# Patient Record
Sex: Female | Born: 1950 | Race: Black or African American | Hispanic: No | Marital: Married | Smoking: Never smoker
Health system: Southern US, Community
[De-identification: ages and names within clinical notes are randomized; demographics above are authoritative.]

## PROBLEM LIST (undated history)

## (undated) DIAGNOSIS — E7800 Pure hypercholesterolemia, unspecified: Secondary | ICD-10-CM

## (undated) DIAGNOSIS — I10 Essential (primary) hypertension: Secondary | ICD-10-CM

## (undated) DIAGNOSIS — M797 Fibromyalgia: Secondary | ICD-10-CM

## (undated) DIAGNOSIS — D219 Benign neoplasm of connective and other soft tissue, unspecified: Secondary | ICD-10-CM

## (undated) DIAGNOSIS — R87619 Unspecified abnormal cytological findings in specimens from cervix uteri: Secondary | ICD-10-CM

## (undated) HISTORY — DX: Pure hypercholesterolemia, unspecified: E78.00

## (undated) HISTORY — PX: OTHER SURGICAL HISTORY: SHX169

## (undated) HISTORY — DX: Fibromyalgia: M79.7

## (undated) HISTORY — DX: Unspecified abnormal cytological findings in specimens from cervix uteri: R87.619

## (undated) HISTORY — PX: ABDOMINAL HYSTERECTOMY: SHX81

## (undated) HISTORY — DX: Essential (primary) hypertension: I10

## (undated) HISTORY — DX: Benign neoplasm of connective and other soft tissue, unspecified: D21.9

---

## 1998-05-15 HISTORY — PX: REDUCTION MAMMAPLASTY: SUR839

## 1998-05-31 ENCOUNTER — Ambulatory Visit (HOSPITAL_COMMUNITY): Admission: RE | Admit: 1998-05-31 | Discharge: 1998-05-31 | Payer: Self-pay | Admitting: Obstetrics and Gynecology

## 1998-06-24 ENCOUNTER — Other Ambulatory Visit: Admission: RE | Admit: 1998-06-24 | Discharge: 1998-06-24 | Payer: Self-pay | Admitting: Obstetrics and Gynecology

## 1998-07-13 ENCOUNTER — Inpatient Hospital Stay (HOSPITAL_COMMUNITY): Admission: RE | Admit: 1998-07-13 | Discharge: 1998-07-15 | Payer: Self-pay | Admitting: Obstetrics and Gynecology

## 1998-11-05 ENCOUNTER — Ambulatory Visit (HOSPITAL_COMMUNITY): Admission: RE | Admit: 1998-11-05 | Discharge: 1998-11-05 | Payer: Self-pay | Admitting: Gastroenterology

## 1999-07-20 ENCOUNTER — Encounter: Payer: Self-pay | Admitting: Surgery

## 1999-07-20 ENCOUNTER — Encounter: Admission: RE | Admit: 1999-07-20 | Discharge: 1999-07-20 | Payer: Self-pay | Admitting: Surgery

## 1999-07-21 ENCOUNTER — Ambulatory Visit (HOSPITAL_COMMUNITY): Admission: RE | Admit: 1999-07-21 | Discharge: 1999-07-21 | Payer: Self-pay | Admitting: Cardiology

## 1999-08-29 ENCOUNTER — Other Ambulatory Visit: Admission: RE | Admit: 1999-08-29 | Discharge: 1999-08-29 | Payer: Self-pay | Admitting: Obstetrics and Gynecology

## 2001-08-05 ENCOUNTER — Encounter: Admission: RE | Admit: 2001-08-05 | Discharge: 2001-08-05 | Payer: Self-pay | Admitting: Family Medicine

## 2001-08-05 ENCOUNTER — Encounter: Payer: Self-pay | Admitting: Family Medicine

## 2002-05-15 HISTORY — PX: BREAST BIOPSY: SHX20

## 2002-10-02 ENCOUNTER — Encounter: Admission: RE | Admit: 2002-10-02 | Discharge: 2002-10-02 | Payer: Self-pay | Admitting: Family Medicine

## 2002-10-02 ENCOUNTER — Encounter: Payer: Self-pay | Admitting: Family Medicine

## 2003-12-25 ENCOUNTER — Encounter: Admission: RE | Admit: 2003-12-25 | Discharge: 2003-12-25 | Payer: Self-pay | Admitting: Obstetrics and Gynecology

## 2005-07-06 ENCOUNTER — Encounter: Admission: RE | Admit: 2005-07-06 | Discharge: 2005-07-06 | Payer: Self-pay | Admitting: Obstetrics and Gynecology

## 2005-09-13 ENCOUNTER — Encounter: Payer: Self-pay | Admitting: Obstetrics and Gynecology

## 2006-01-12 ENCOUNTER — Encounter (INDEPENDENT_AMBULATORY_CARE_PROVIDER_SITE_OTHER): Payer: Self-pay | Admitting: *Deleted

## 2006-01-12 ENCOUNTER — Ambulatory Visit (HOSPITAL_BASED_OUTPATIENT_CLINIC_OR_DEPARTMENT_OTHER): Admission: RE | Admit: 2006-01-12 | Discharge: 2006-01-12 | Payer: Self-pay | Admitting: Surgery

## 2006-04-12 ENCOUNTER — Encounter: Admission: RE | Admit: 2006-04-12 | Discharge: 2006-04-12 | Payer: Self-pay | Admitting: Family Medicine

## 2007-03-28 ENCOUNTER — Encounter: Admission: RE | Admit: 2007-03-28 | Discharge: 2007-03-28 | Payer: Self-pay | Admitting: Obstetrics & Gynecology

## 2007-08-26 ENCOUNTER — Other Ambulatory Visit: Admission: RE | Admit: 2007-08-26 | Discharge: 2007-08-26 | Payer: Self-pay | Admitting: Obstetrics & Gynecology

## 2008-05-26 ENCOUNTER — Encounter: Admission: RE | Admit: 2008-05-26 | Discharge: 2008-05-26 | Payer: Self-pay | Admitting: Obstetrics & Gynecology

## 2008-08-27 ENCOUNTER — Other Ambulatory Visit: Admission: RE | Admit: 2008-08-27 | Discharge: 2008-08-27 | Payer: Self-pay | Admitting: Obstetrics and Gynecology

## 2009-02-03 ENCOUNTER — Ambulatory Visit (HOSPITAL_BASED_OUTPATIENT_CLINIC_OR_DEPARTMENT_OTHER): Admission: RE | Admit: 2009-02-03 | Discharge: 2009-02-03 | Payer: Self-pay | Admitting: Orthopedic Surgery

## 2009-07-26 ENCOUNTER — Encounter: Admission: RE | Admit: 2009-07-26 | Discharge: 2009-07-26 | Payer: Self-pay | Admitting: Obstetrics & Gynecology

## 2010-06-05 ENCOUNTER — Encounter: Payer: Self-pay | Admitting: Family Medicine

## 2010-06-05 ENCOUNTER — Encounter: Payer: Self-pay | Admitting: Obstetrics & Gynecology

## 2010-08-19 LAB — BASIC METABOLIC PANEL
BUN: 11 mg/dL (ref 6–23)
CO2: 28 mEq/L (ref 19–32)
Calcium: 9.4 mg/dL (ref 8.4–10.5)
Chloride: 101 mEq/L (ref 96–112)
Creatinine, Ser: 0.83 mg/dL (ref 0.4–1.2)
GFR calc Af Amer: >60 mL/min (ref >60)
GFR calc non Af Amer: >60 mL/min (ref >60)
Glucose, Bld: 107 mg/dL — ABNORMAL HIGH (ref 70–99)
Potassium: 3.4 mEq/L — ABNORMAL LOW (ref 3.5–5.1)
Sodium: 140 mEq/L (ref 135–145)

## 2010-08-19 LAB — POCT HEMOGLOBIN-HEMACUE: Hemoglobin: 12.5 g/dL (ref 12.0–15.0)

## 2010-08-23 ENCOUNTER — Other Ambulatory Visit: Payer: Self-pay | Admitting: Obstetrics & Gynecology

## 2010-08-23 DIAGNOSIS — Z1231 Encounter for screening mammogram for malignant neoplasm of breast: Secondary | ICD-10-CM

## 2010-08-29 ENCOUNTER — Ambulatory Visit
Admission: RE | Admit: 2010-08-29 | Discharge: 2010-08-29 | Disposition: A | Discharge status: Home / Self Care | Payer: Federal, State, Local not specified - PPO | Source: Ambulatory Visit | Attending: Obstetrics & Gynecology | Admitting: Obstetrics & Gynecology

## 2010-08-29 DIAGNOSIS — Z1231 Encounter for screening mammogram for malignant neoplasm of breast: Secondary | ICD-10-CM

## 2010-09-30 NOTE — Cardiovascular Report (Signed)
[place]  Patient:    Kayla Rush, BAUMGARDNER                  MRN: [id number] Proc. Date: 07/21/99 Adm. Date:  [id number] Disc. Date: [id number] Attending:  Corliss Marcus CC:         Joycelyn Rua, M.D.             Cardiac Catheterization Laboratory                        Cardiac Catheterization  CINE NUMBER:  [id number]  REFERRING PHYSICIAN:  Joycelyn Rua, M.D.  PROCEDURES PERFORMED: 1. Left heart catheterization. 2. Coronary angiography. 3. Left ventriculogram.  INDICATIONS:  Ms. Maliaka Brasington is a 60 year old woman who was admitted to a hospital in [place] overnight after a prolonged episode of anginal chest pain.  She ruled out for myocardial infarction.  She has not had a recurrence for this. Her ECG shows inferior T wave inversion suggestive of a thick inferior ischemia. She is brought now to the cardiac catheterization laboratory to identify a possible atherosclerotic cerebrovascular disease and provide for further therapeutic options.  DESCRIPTION OF PROCEDURE:  The patient was brought to the cardiac catheterization laboratory in the postabsorptive state.  The right groin was prepared and draped in the usual sterile fashion.  Local anesthesia was obtained with the infiltration of 1% lidocaine.  A 5 French catheter sheath was introduced percutaneously into the right femoral artery utilizing an anterior approach over a guiding J wire.  A 110 cm pigtail catheter was then advanced to the ascending aorta where the pressure was recorded.  The catheter was then prolapsed across the aortic valve and the pressure again recorded in the left ventricle both prior to and following the ventriculogram.  A 30 degree RAO cine left ventriculogram was performed utilizing an out by power injector.  Then 45 cc of nonionic contrast material were injected at 13 cc/sec.  Coronary angiography was then performed using a 5 [place] #4 right and left  Judkins catheters.  Cineangiography of each coronary artery was conducted in multiple LAO and RAO projections.  All catheter manipulations were performed  using fluoroscope observation and exchanges performed over a long guiding J wire. At the completion of the procedure, the catheter and catheter sheaths were removed. Hemostasis was achieved by direct pressure.  The patient was transported to the  recovery area in stable condition with intact distal pulses.  FLUOROSCOPY TIME:  3.7 minutes.  TOTAL CONTRAST UTILIZED:  Omnipaque 120 cc.  HEMODYNAMICS:  Systemic arterial pressure was 134/88 with a mean of 109 mmHg. There was no systolic gradient across the aortic valve.  The left ventricular end-diastolic pressure was 15 mmHg preventriculogram and 16 mmHg post ventriculogram.  ANGIOGRAPHY:  LEFT VENTRICULOGRAM:  The left ventriculogram demonstrated a normal chamber size and global systolic function.  There was no regional wall motion abnormalities noted.  There is no mitral regurgitation or coronary calcification seen.  The calculated ejection fraction utilizing a single plane cine method is 72%.  CORONARY ANGIOGRAPHY:  There was a right dominant coronary system present.  The  main left coronary artery was normal.  The main left coronary artery was short and normal.  The left anterior descending artery and its branches were normal.  The left circumflex artery and its branches were normal.  The right coronary artery and its branches were normal.  Collateral vessels were not seen.  FINAL IMPRESSION: 1. Normal  coronary arteries. 2. Intact left ventricular size and systolic function. 3. Noncardiac chest pain.  PLAN:  The patient is presented with this gratifying news.  She will be discharged on an outpatient basis after four hours of bedrest.  Followup with primary care  doctor on a p.r.n. basis if the chest discomfort recurs. DD:  07/21/99 TD:  07/23/99 Job:  [id number]

## 2011-09-20 ENCOUNTER — Other Ambulatory Visit: Payer: Self-pay | Admitting: Obstetrics & Gynecology

## 2011-09-20 DIAGNOSIS — Z1231 Encounter for screening mammogram for malignant neoplasm of breast: Secondary | ICD-10-CM

## 2011-09-28 ENCOUNTER — Ambulatory Visit
Admission: RE | Admit: 2011-09-28 | Discharge: 2011-09-28 | Disposition: A | Discharge status: Home / Self Care | Payer: Federal, State, Local not specified - PPO | Source: Ambulatory Visit | Attending: Obstetrics & Gynecology | Admitting: Obstetrics & Gynecology

## 2011-09-28 DIAGNOSIS — Z1231 Encounter for screening mammogram for malignant neoplasm of breast: Secondary | ICD-10-CM

## 2012-10-22 ENCOUNTER — Encounter: Payer: Self-pay | Admitting: Certified Nurse Midwife

## 2012-10-23 ENCOUNTER — Other Ambulatory Visit: Payer: Self-pay

## 2012-10-23 DIAGNOSIS — Z1231 Encounter for screening mammogram for malignant neoplasm of breast: Secondary | ICD-10-CM

## 2012-10-28 ENCOUNTER — Ambulatory Visit: Payer: Self-pay | Admitting: Certified Nurse Midwife

## 2012-11-20 ENCOUNTER — Ambulatory Visit
Admission: RE | Admit: 2012-11-20 | Discharge: 2012-11-20 | Disposition: A | Discharge status: Home / Self Care | Payer: Federal, State, Local not specified - PPO | Source: Ambulatory Visit

## 2012-11-20 DIAGNOSIS — Z1231 Encounter for screening mammogram for malignant neoplasm of breast: Secondary | ICD-10-CM

## 2012-11-27 ENCOUNTER — Encounter: Payer: Self-pay | Admitting: Certified Nurse Midwife

## 2012-11-27 ENCOUNTER — Ambulatory Visit (INDEPENDENT_AMBULATORY_CARE_PROVIDER_SITE_OTHER): Payer: Federal, State, Local not specified - PPO | Admitting: Certified Nurse Midwife

## 2012-11-27 VITALS — BP 124/74 | HR 64 | Resp 16 | Ht 68.25 in | Wt 268.0 lb

## 2012-11-27 DIAGNOSIS — Z01419 Encounter for gynecological examination (general) (routine) without abnormal findings: Secondary | ICD-10-CM

## 2012-11-27 NOTE — Progress Notes (Signed)
62 y.o. [id number] Married African American Fe here for annual exam. Menopausal no HRT. Denies vaginal bleeding or dryness. Hypertension and cholesterol stable on medication with PCP management. No health issues today.  Sees PCP aex, labs.  Son recently graduated from college!   Patient's last menstrual period was 06/15/1998.          Sexually active: yes  The current method of family planning is status post hysterectomy.    Exercising: no  exercise Smoker:  no  Health Maintenance: Pap:  08-27-08 neg MMG:  11-20-12 neg Colonoscopy:  2010 negative( 5 years due to family history) BMD:   2010 TDaP:  2005 Labs: none Self breast exam:monthly   reports that she has never smoked. She does not have any smokeless tobacco history on file. She reports that she does not drink alcohol or use illicit drugs.  Past Medical History  Diagnosis Date  . Hypertension   . Fibromyalgia   . Hypercholesteremia   . Fibroid     Past Surgical History  Procedure Laterality Date  . Augmentation mammaplasty      breast reduction  . Abdominal hysterectomy      2/00  . Bone spur Left   . Breast biopsy      times 3    Current Outpatient Prescriptions  Medication Sig Dispense Refill  . amLODipine (NORVASC) 5 MG tablet Take 5 mg by mouth daily.      Marland Kitchen aspirin 81 MG tablet Take 81 mg by mouth daily.      Marland Kitchen CALCIUM PO Take by mouth daily.      Marland Kitchen lisinopril-hydrochlorothiazide (PRINZIDE,ZESTORETIC) 20-25 MG per tablet Take 1 tablet by mouth daily.      . Multiple Vitamins-Minerals (MULTIVITAMIN PO) Take by mouth daily.      . pravastatin (PRAVACHOL) 80 MG tablet Take 80 mg by mouth daily.      . Vitamin D, Ergocalciferol, (DRISDOL) 50000 UNITS CAPS Take 50,000 Units by mouth every 14 (fourteen) days.       No current facility-administered medications for this visit.    Family History  Problem Relation Age of Onset  . Cancer Mother     colon  . Hypertension Mother   . Heart disease Father   . Heart  disease Brother   . Hypertension Maternal Grandmother   . Hypertension Maternal Grandfather     ROS:  Pertinent items are noted in HPI.  Otherwise, a comprehensive ROS was negative.  Exam:   BP 124/74  Pulse 64  Resp 16  Ht 5' 8.25" (1.734 m)  Wt 268 lb (121.564 kg)  BMI 40.43 kg/m2  LMP 06/15/1998 Height: 5' 8.25" (173.4 cm)  Ht Readings from Last 3 Encounters:  11/27/12 5' 8.25" (1.734 m)    General appearance: alert, cooperative and appears stated age Head: Normocephalic, without obvious abnormality, atraumatic Neck: no adenopathy, supple, symmetrical, trachea midline and thyroid normal to inspection and palpation Lungs: clear to auscultation bilaterally Breasts: normal appearance, no masses or tenderness, No nipple retraction or dimpling, No nipple discharge or bleeding, No axillary or supraclavicular adenopathy, reduction scars present bilaterally Heart: regular rate and rhythm Abdomen: soft, non-tender; no masses,  no organomegaly Extremities: extremities normal, atraumatic, no cyanosis or edema Skin: Skin color, texture, turgor normal. No rashes or lesions Lymph nodes: Cervical, supraclavicular, and axillary nodes normal. No abnormal inguinal nodes palpated Neurologic: Grossly normal   Pelvic: External genitalia:  no lesions  Urethra:  normal appearing urethra with no masses, tenderness or lesions              Bartholin's and Skene's: normal                 Vagina: normal appearing vagina with normal color and discharge, no lesions              Cervix: absent              Pap taken: no Bimanual Exam:  Uterus:  uterus absent              Adnexa: no mass, fullness, tenderness and normal ovaries palpated on the right left absent               Rectovaginal: Confirms               Anus:  normal sphincter tone, no lesions  A:  Well Woman with normal exam  Menopausal no HRT  S/P TAH LSO due to fibroids/bleeding  Hypertension/ cholesterol stable on medication  with PCP management  P:   Reviewed health and wellness pertinent to exam  Pap smear as per guidelines   Mammogram yearly pap smear not taken today  counseled on breast self exam, mammography screening, feminine hygiene, adequate intake of calcium and vitamin D, diet and exercise, Kegel's exercises  return annually or prn  An After Visit Summary was printed and given to the patient.  Reviewed, TL

## 2012-11-27 NOTE — Patient Instructions (Signed)

## 2013-03-20 ENCOUNTER — Other Ambulatory Visit: Payer: Self-pay

## 2013-08-05 ENCOUNTER — Telehealth: Payer: Self-pay | Admitting: Certified Nurse Midwife

## 2013-08-05 ENCOUNTER — Ambulatory Visit (INDEPENDENT_AMBULATORY_CARE_PROVIDER_SITE_OTHER): Payer: Federal, State, Local not specified - PPO | Admitting: Certified Nurse Midwife

## 2013-08-05 VITALS — BP 110/74 | HR 68 | Resp 16 | Ht 68.25 in | Wt 270.0 lb

## 2013-08-05 DIAGNOSIS — N61 Mastitis without abscess: Secondary | ICD-10-CM

## 2013-08-05 DIAGNOSIS — N611 Abscess of the breast and nipple: Secondary | ICD-10-CM

## 2013-08-05 MED ORDER — DOXYCYCLINE HYCLATE 100 MG PO CAPS: 100.0000 mg | ORAL_CAPSULE | Freq: Two times a day (BID) | ORAL | Status: DC

## 2013-08-05 NOTE — Telephone Encounter (Signed)
Spoke with pt who has a skin lesion on each breast that has been there about 2 weeks. Pt says it started with areas that looked like she scratched herself, and now they are worse and she is wondering if they are infected. Sched appt today at 1:15 with DL.

## 2013-08-05 NOTE — Progress Notes (Signed)
63 y.o. Married [place] American female [phone] here with complaint of breast papule on both breasts. Noticed two weeks ago, denies insect bite, or injury to breast. No new personal products or new bras. Does not work out in gym.Patient denies fever or chills. Patient had previous history of Hidradenitis. Denies breast mass, nipple discharge or change in appearance other than where the areas are. Last mammogram 11/21/12 normal.   O: Healthy WD,WN female Affect: normal, orientation x 3 Skin:warm and dry  Right breast at 8 o'clock 4 fb from areola one cm slightly pink healing abscess noted on skin with slight milky exudate from under edge of scab when touched. Not tender. No axillary enlarged lymph nodes. Right breast exam, no masses palpated or nipple discharge. Reduction mammoplasty scars present. Culture obtained from area of abscess.. Left breast at 6 o'clock 1 cm oval abscess noted, slightly pink with no exudate noted when skin touched. Not tender, no axillary enlarged lymph nodes. Slight increase pink along bra line, no abscess noted( patient noted this after removing bra). Left breast exam, no masses, no nipple discharge, reduction mammoplasty scars present. Culture obtained from area of abscess.  A:Skin abscess ?hidradenitis    P: Discussed findings of skin abscess, ? Hidradenitis. Discussed need for Rx to clear and decrease risk of other areas. Patient agreeable. Also discussed using Hibiclens wash to clean area gently. Instructions for use daily given. Warning signs and symptoms given, and need to advise. Rx Doxycycline see order. Lab: culture for MRSA and wound culture  Dr. Charlies Constable viewed area and agreeable to findings.   RV one week, prn.

## 2013-08-05 NOTE — Patient Instructions (Addendum)
Hidradenitis Suppurativa, Sweat Gland Abscess °Hidradenitis suppurativa is a long lasting (chronic), uncommon disease of the sweat glands. With this, boil-like lumps and scarring develop in the groin, some times under the arms (axillae), and under the breasts. It may also uncommonly occur behind the ears, in the crease of the buttocks, and around the genitals.  °CAUSES  °The cause is from a blocking of the sweat glands. They then become infected. It may cause drainage and odor. It is not contagious. So it cannot be given to someone else. It most often shows up in puberty (about 10 to 64 years of age). But it may happen much later. It is similar to acne which is a disease of the sweat glands. This condition is slightly more common in African-Americans and women. °SYMPTOMS  °· Hidradenitis usually starts as one or more red, tender, swellings in the groin or under the arms (axilla). °· Over a period of hours to days the lesions get larger. They often open to the skin surface, draining clear to yellow-colored fluid. °· The infected area heals with scarring. °DIAGNOSIS  °Your caregiver makes this diagnosis by looking at you. Sometimes cultures (growing germs on plates in the lab) may be taken. This is to see what germ (bacterium) is causing the infection.  °TREATMENT  °· Topical germ killing medicine applied to the skin (antibiotics) are the treatment of choice. Antibiotics taken by mouth (systemic) are sometimes needed when the condition is getting worse or is severe. °· Avoid tight-fitting clothing which traps moisture in. °· Dirt does not cause hidradenitis and it is not caused by poor hygiene. °· Involved areas should be cleaned daily using an antibacterial soap. Some patients find that the liquid form of Lever 2000®, applied to the involved areas as a lotion after bathing, can help reduce the odor related to this condition. °· Sometimes surgery is needed to drain infected areas or remove scarred tissue. Removal of  large amounts of tissue is used only in severe cases. °· Birth control pills may be helpful. °· Oral retinoids (vitamin A derivatives) for 6 to 12 months which are effective for acne may also help this condition. °· Weight loss will improve but not cure hidradenitis. It is made worse by being overweight. But the condition is not caused by being overweight. °· This condition is more common in people who have had acne. °· It may become worse under stress. °There is no medical cure for hidradenitis. It can be controlled, but not cured. The condition usually continues for years with periods of getting worse and getting better (remission). °Document Released: 12/14/2003 Document Revised: 07/24/2011 Document Reviewed: 12/30/2007 °ExitCare® Patient Information ©2014 [employer]. ° °

## 2013-08-05 NOTE — Telephone Encounter (Signed)
Pt has breast lesions on both breasts she would like to have check.

## 2013-08-06 NOTE — Progress Notes (Signed)
Pt seen and examined with Ms. Hollice Espy, no evidence of hydranditis in 2 areas of concern, no enlarged LN, agree with cultures and doxycycline. To see pt in follow up

## 2013-08-08 LAB — WOUND CULTURE
Gram Stain: NONE SEEN
Gram Stain: NONE SEEN
Gram Stain: NONE SEEN
Organism ID, Bacteria: NO GROWTH

## 2013-08-08 LAB — MRSA CULTURE

## 2013-08-13 ENCOUNTER — Encounter: Payer: Self-pay | Admitting: Certified Nurse Midwife

## 2013-08-13 ENCOUNTER — Ambulatory Visit (INDEPENDENT_AMBULATORY_CARE_PROVIDER_SITE_OTHER): Payer: Federal, State, Local not specified - PPO | Admitting: Certified Nurse Midwife

## 2013-08-13 VITALS — BP 110/70 | HR 80 | Resp 14 | Ht 68.25 in | Wt 267.0 lb

## 2013-08-13 DIAGNOSIS — L0390 Cellulitis, unspecified: Secondary | ICD-10-CM

## 2013-08-13 DIAGNOSIS — L732 Hidradenitis suppurativa: Secondary | ICD-10-CM

## 2013-08-13 DIAGNOSIS — L0291 Cutaneous abscess, unspecified: Secondary | ICD-10-CM

## 2013-08-13 NOTE — Progress Notes (Signed)
63 y.o. Married [place] American female [phone] here for follow up of skin abscess under breast treated with Doxycycline initiated on 08/05/13. Cultures taken from area were negative for any pathogens and MRSA. Patient continues medication as directed. Denies any discharge or bleeding from area. Denies fever or chills or pain. "Feels so much better". Patient working on finding a bra that does not rub area. No other health concerns.   O: Healthy WD,WN female Affect: Normal, orientation x 3 Skin:warm and dry  Exam: Area under both breast has closed, no open wound or discharge or pus from area. No redness or tenderness to touch now. No axillary lymph node enlargement or tenderness.  A.Suspect Hidradenitis Suppurativa with skin abscess, healed. Cultures negative Patient had previous history of Hidradenitis.   P: Discussed findings of healed abscess. Continue medication until completed. Agree with patient that bra and perspiration contributed to problem. Suggested trying just wearing camisole instead of bra due large reduction scars. Patient will try. Also suggested vaseline over area to protect with bra use. Patient will advise if status change.    RV prn

## 2013-08-15 NOTE — Progress Notes (Signed)
Reviewed personally.  M. Suzanne Dossie Ocanas, MD.  

## 2013-09-17 ENCOUNTER — Encounter: Payer: Self-pay | Admitting: Certified Nurse Midwife

## 2013-10-15 ENCOUNTER — Telehealth: Payer: Self-pay | Admitting: Certified Nurse Midwife

## 2013-10-15 ENCOUNTER — Encounter: Payer: Self-pay | Admitting: Certified Nurse Midwife

## 2013-10-15 ENCOUNTER — Ambulatory Visit (INDEPENDENT_AMBULATORY_CARE_PROVIDER_SITE_OTHER): Payer: Federal, State, Local not specified - PPO | Admitting: Certified Nurse Midwife

## 2013-10-15 VITALS — BP 108/64 | HR 70 | Resp 16 | Ht 68.25 in | Wt 266.0 lb

## 2013-10-15 DIAGNOSIS — L732 Hidradenitis suppurativa: Secondary | ICD-10-CM

## 2013-10-15 MED ORDER — MUPIROCIN 2 % EX OINT: TOPICAL_OINTMENT | CUTANEOUS | Status: DC

## 2013-10-15 NOTE — Progress Notes (Signed)
   Subjective:   63 y.o. MarriedAfrican American female presents for evaluation of right breast area of previous Hydradenitis on scar line of breast reduction. Patient treated with Doxycycline on 08/05/13 and rechecked on 08/13/13 with closure healing noted. Cultures were negative for staph and MRSA. Patient has noticed that it will heal/ reoccur just in one small area only with clear drainage. No tenderness,  Or pus noted.. Does self breast exam and has not noted any changes. Tried several types of bras and it is annoying.. Denies fever or chills or other issues with   .Review of Systems Pertinent to history.    Objective:   General appearance: alert, appears stated age and no distress Breasts: No nipple retraction or dimpling, No nipple discharge or bleeding, No axillary or supraclavicular adenopathy, bilateral breast reduction scars and previous scars from Hidradenitis on breast. No axillary lymph node enlargement or tenderness. No masses noted bilateral. Small crusty bb size area on scar under left  Breast noted. Scab present, no pustular exudate. No redness, non tender.   Assessment:   Patient is diagnosed with Hydradenitis Suppurativa    Plan:   Discussed with patient due to negative cultures previously, will not open small area to re culture. Discussed importance of not squeezing area and limiting rubbing of area with bra. Patient understands and working on this with new bra. Aware of warning signs if occurs and need to advise. Rx Bactroban ointment see order with instructions.  Rv prn

## 2013-10-15 NOTE — Telephone Encounter (Signed)
Patient has a breast lesion and would like an appointment with Evalee Mutton.

## 2013-10-15 NOTE — Telephone Encounter (Signed)
Spoke with patient. Patient states that she was seen by Regina Eck CNM 2 months ago for "breast lesions." States that two of them have healed but she still has one that will not heal. "There are some days where it will be crusty for a couple of days like it is going to heal and then it opens back up." "Lesion" is on patient's left breast and is currently open. Patient states that it is oozing with yellow discharge. Patient denies pain, warmth at the site, fever, nausea, and chills. Patient requesting to see Regina Eck CNM. Appointment scheduled for today at 12:45pm with Regina Eck CNM (time okay per provider). Patient agreeable to date and time.   Routing to provider for final review. Patient agreeable to disposition. Will close encounter

## 2013-10-29 NOTE — Progress Notes (Signed)
Reviewed personally.  M. Suzanne Miller, MD.  

## 2013-11-11 ENCOUNTER — Encounter: Payer: Self-pay | Admitting: Certified Nurse Midwife

## 2013-12-01 ENCOUNTER — Ambulatory Visit: Payer: Federal, State, Local not specified - PPO | Admitting: Certified Nurse Midwife

## 2013-12-04 ENCOUNTER — Encounter: Payer: Self-pay | Admitting: Certified Nurse Midwife

## 2013-12-04 ENCOUNTER — Ambulatory Visit (INDEPENDENT_AMBULATORY_CARE_PROVIDER_SITE_OTHER): Payer: Federal, State, Local not specified - PPO | Admitting: Certified Nurse Midwife

## 2013-12-04 VITALS — BP 118/62 | HR 72 | Resp 16 | Ht 68.0 in | Wt 266.0 lb

## 2013-12-04 DIAGNOSIS — Z01419 Encounter for gynecological examination (general) (routine) without abnormal findings: Secondary | ICD-10-CM

## 2013-12-04 DIAGNOSIS — Z23 Encounter for immunization: Secondary | ICD-10-CM

## 2013-12-04 NOTE — Progress Notes (Signed)
63 y.o. [id number] Married African American Fe here for annual exam. Menopausal no HRT. Denies vaginal bleeding or vagina dryness. Patient has noticed rash on wrist, but now has resolved. Patient was wearing Fizbit and now feels it was a reaction to band. Area on left breast scar has closed and healed after using las RX of Mupirocin ointment, given at last evaluation of. No more problems with. Hypertension stable on medication with PCP. Recent PCP aex, labs all normal. No health issues today. Retiring soon!!  Patient's last menstrual period was 06/15/1998.          Sexually active: Yes.    The current method of family planning is status post hysterectomy.    Exercising: No.  The patient does not participate in regular exercise at present. Smoker:  no  Health Maintenance: Pap:  08/2008 Neg MMG:  11/2012 BIRADS1: Neg Colonoscopy:  2010 - Every 5 years  BMD:  05/2008  TDaP:  2005  Labs: PCP   reports that she has never smoked. She has never used smokeless tobacco. She reports that she drinks about .5 ounces of alcohol per week. She reports that she does not use illicit drugs.  Past Medical History  Diagnosis Date  . Hypertension   . Fibromyalgia   . Hypercholesteremia   . Fibroid     Past Surgical History  Procedure Laterality Date  . Augmentation mammaplasty      breast reduction  . Abdominal hysterectomy      2/00  . Bone spur Left   . Breast biopsy      times 3    Current Outpatient Prescriptions  Medication Sig Dispense Refill  . amLODipine (NORVASC) 5 MG tablet Take 5 mg by mouth daily.      Marland Kitchen aspirin 81 MG tablet Take 81 mg by mouth daily.      Marland Kitchen CALCIUM PO Take by mouth daily.      Marland Kitchen FREESTYLE LITE test strip       . lisinopril-hydrochlorothiazide (PRINZIDE,ZESTORETIC) 20-25 MG per tablet Take 1 tablet by mouth daily.      . Multiple Vitamins-Minerals (MULTIVITAMIN PO) Take by mouth daily.      . pravastatin (PRAVACHOL) 80 MG tablet Take 80 mg by mouth daily.      .  Vitamin D, Ergocalciferol, (DRISDOL) 50000 UNITS CAPS Take 50,000 Units by mouth every 14 (fourteen) days.      . mupirocin ointment (BACTROBAN) 2 % Apply 2-3 times daily for 5 days.  22 g  0   No current facility-administered medications for this visit.    Family History  Problem Relation Age of Onset  . Cancer Mother     colon  . Hypertension Mother   . Heart disease Father   . Heart disease Brother   . Hypertension Maternal Grandmother   . Hypertension Maternal Grandfather     ROS:  Pertinent items are noted in HPI.  Otherwise, a comprehensive ROS was negative.  Exam:   BP 118/62  Pulse 72  Resp 16  Ht 5\' 8"  (1.727 m)  Wt 266 lb (120.657 kg)  BMI 40.45 kg/m2  LMP 06/15/1998 Height: 5\' 8"  (172.7 cm)  Ht Readings from Last 3 Encounters:  12/04/13 5\' 8"  (1.727 m)  10/15/13 5' 8.25" (1.734 m)  08/13/13 5' 8.25" (1.734 m)    General appearance: alert, cooperative and appears stated age Head: Normocephalic, without obvious abnormality, atraumatic Neck: no adenopathy, supple, symmetrical, trachea midline and thyroid normal to inspection and palpation and  non-palpable Lungs: clear to auscultation bilaterally Breasts: normal appearance, no masses or tenderness, No nipple retraction or dimpling, No nipple discharge or bleeding, No axillary or supraclavicular adenopathy Heart: regular rate and rhythm Abdomen: soft, non-tender; no masses,  no organomegaly Extremities: extremities normal, atraumatic, no cyanosis or edema Skin: Skin color, texture, turgor normal. No rashes or lesions Lymph nodes: Cervical, supraclavicular, and axillary nodes normal. No abnormal inguinal nodes palpated Neurologic: Grossly normal   Pelvic: External genitalia:  no lesions              Urethra:  normal appearing urethra with no masses, tenderness or lesions              Bartholin's and Skene's: normal                 Vagina: normal appearing vagina with normal color and discharge, no lesions               Cervix: absent              Pap taken: No. Bimanual Exam:  Uterus:  uterus absent              Adnexa: normal adnexa and no mass, fullness, tenderness right, S/PLSO               Rectovaginal: Confirms               Anus:  normal sphincter tone, no lesions  A:  Well Woman with normal exam  Menopausal no HRT S/P TAH LSO fibroids  Hypertension stable medication with PCP management  Family history of colon cancer mother  P:   Reviewed health and wellness pertinent to exam  Continue follow up as indicated  Pap smear not taken today  counseled on breast self exam, mammography screening, adequate intake of calcium and vitamin D, diet and exercise, Kegel's exercises  return annually or prn  An After Visit Summary was printed and given to the patient.

## 2013-12-04 NOTE — Patient Instructions (Signed)

## 2013-12-05 NOTE — Progress Notes (Signed)
Note reviewed, agree with plan.  Talayia Hjort, MD  

## 2014-01-01 ENCOUNTER — Other Ambulatory Visit: Payer: Self-pay

## 2014-01-01 DIAGNOSIS — Z1231 Encounter for screening mammogram for malignant neoplasm of breast: Secondary | ICD-10-CM

## 2014-01-14 ENCOUNTER — Ambulatory Visit
Admission: RE | Admit: 2014-01-14 | Discharge: 2014-01-14 | Disposition: A | Discharge status: Home / Self Care | Payer: Federal, State, Local not specified - PPO | Source: Ambulatory Visit

## 2014-01-14 DIAGNOSIS — Z1231 Encounter for screening mammogram for malignant neoplasm of breast: Secondary | ICD-10-CM

## 2014-02-27 ENCOUNTER — Other Ambulatory Visit: Payer: Self-pay

## 2014-03-16 ENCOUNTER — Encounter: Payer: Self-pay | Admitting: Certified Nurse Midwife

## 2014-04-16 ENCOUNTER — Other Ambulatory Visit: Payer: Self-pay | Admitting: Gastroenterology

## 2014-12-10 ENCOUNTER — Encounter: Payer: Self-pay | Admitting: Certified Nurse Midwife

## 2014-12-10 ENCOUNTER — Ambulatory Visit (INDEPENDENT_AMBULATORY_CARE_PROVIDER_SITE_OTHER): Payer: Federal, State, Local not specified - PPO

## 2014-12-10 ENCOUNTER — Ambulatory Visit (INDEPENDENT_AMBULATORY_CARE_PROVIDER_SITE_OTHER): Payer: Federal, State, Local not specified - PPO | Admitting: Obstetrics and Gynecology

## 2014-12-10 ENCOUNTER — Ambulatory Visit (INDEPENDENT_AMBULATORY_CARE_PROVIDER_SITE_OTHER): Payer: Federal, State, Local not specified - PPO | Admitting: Certified Nurse Midwife

## 2014-12-10 ENCOUNTER — Other Ambulatory Visit: Payer: Self-pay | Admitting: *Deleted

## 2014-12-10 ENCOUNTER — Encounter: Payer: Self-pay | Admitting: Obstetrics and Gynecology

## 2014-12-10 VITALS — BP 122/70 | HR 70 | Temp 98.3°F | Resp 16 | Ht 68.25 in | Wt 256.0 lb

## 2014-12-10 DIAGNOSIS — R102 Pelvic and perineal pain: Secondary | ICD-10-CM | POA: Diagnosis not present

## 2014-12-10 DIAGNOSIS — R1031 Right lower quadrant pain: Secondary | ICD-10-CM | POA: Diagnosis not present

## 2014-12-10 DIAGNOSIS — N390 Urinary tract infection, site not specified: Secondary | ICD-10-CM | POA: Diagnosis not present

## 2014-12-10 DIAGNOSIS — Z01419 Encounter for gynecological examination (general) (routine) without abnormal findings: Secondary | ICD-10-CM

## 2014-12-10 LAB — CBC WITH DIFFERENTIAL/PLATELET
BASOS ABS: 0.1 10*3/uL (ref 0.0–0.1)
BASOS PCT: 1 % (ref 0–1)
EOS ABS: 0.3 10*3/uL (ref 0.0–0.7)
EOS PCT: 4 % (ref 0–5)
HEMATOCRIT: 40.1 % (ref 36.0–46.0)
HEMOGLOBIN: 14.2 g/dL (ref 12.0–15.0)
LYMPHS ABS: 3.2 10*3/uL (ref 0.7–4.0)
Lymphocytes Relative: 45 % (ref 12–46)
MCH: 26.7 pg (ref 26.0–34.0)
MCHC: 35.4 g/dL (ref 30.0–36.0)
MCV: 75.4 fL — AB (ref 78.0–100.0)
MONOS PCT: 7 % (ref 3–12)
MPV: 12.4 fL (ref 8.6–12.4)
Monocytes Absolute: 0.5 10*3/uL (ref 0.1–1.0)
NEUTROS ABS: 3.1 10*3/uL (ref 1.7–7.7)
NEUTROS PCT: 43 % (ref 43–77)
PLATELETS: 245 10*3/uL (ref 150–400)
RBC: 5.32 MIL/uL — ABNORMAL HIGH (ref 3.87–5.11)
RDW: 16 % — ABNORMAL HIGH (ref 11.5–15.5)
WBC: 7.2 10*3/uL (ref 4.0–10.5)

## 2014-12-10 LAB — POCT URINALYSIS DIPSTICK
Bilirubin, UA: NEGATIVE
Blood, UA: NEGATIVE
Glucose, UA: NEGATIVE
KETONES UA: NEGATIVE
Nitrite, UA: NEGATIVE
Protein, UA: NEGATIVE
Urobilinogen, UA: NEGATIVE
pH, UA: 5

## 2014-12-10 NOTE — Progress Notes (Signed)
     S:She c/o a constant dull ache in the RLQ for the last week, 5/10 in severity. No fevers, chills, diarrhea, constipation, or urinary c/o. She does c/o mild bloating. She has a h/o fibromyalgia, this feels different. She has a h/o a hysterectomy and LSO, still has her right ovary.   Past Medical History  Diagnosis Date  . Hypertension   . Fibromyalgia   . Hypercholesteremia   . Fibroid    Past Surgical History  Procedure Laterality Date  . Augmentation mammaplasty      breast reduction  . Abdominal hysterectomy      2/00  . Bone spur Left   . Breast biopsy      times 3   ROS: as above   O: General: alert female in NAD Abd: soft, tender in the RLQ, no rebound, no guarding, no masses, not distended. Just as tender with palpation in the lower abdomen with tensing of her abdominal wall.  Ultrasound with normal right ovary, absent uterus and left adnexa. No free fluid.  Normal CBC with diff from earlier today CBC    Component Value Date/Time   WBC 7.2 12/10/2014 1034   RBC 5.32* 12/10/2014 1034   HGB 14.2 12/10/2014 1034   HCT 40.1 12/10/2014 1034   PLT 245 12/10/2014 1034   MCV 75.4* 12/10/2014 1034   MCH 26.7 12/10/2014 1034   MCHC 35.4 12/10/2014 1034   RDW 16.0* 12/10/2014 1034   LYMPHSABS 3.2 12/10/2014 1034   MONOABS 0.5 12/10/2014 1034   EOSABS 0.3 12/10/2014 1034   BASOSABS 0.1 12/10/2014 1034      A/P RLQ abdominal pain, suspect musculoskeletal pain. Normal gyn ultrasound, normal CBC Plan: use ibuprofen, heat and cold to her abdomen          F/u with her primary MD next week if she isn't improving.

## 2014-12-10 NOTE — Patient Instructions (Signed)
EXERCISE AND DIET:  We recommended that you start or continue a regular exercise program for good health. Regular exercise means any activity that makes your heart beat faster and makes you sweat.  We recommend exercising at least 30 minutes per day at least 3 days a week, preferably 4 or 5.  We also recommend a diet low in fat and sugar.  Inactivity, poor dietary choices and obesity can cause diabetes, heart attack, stroke, and kidney damage, among others.    ALCOHOL AND SMOKING:  Women should limit their alcohol intake to no more than 7 drinks/beers/glasses of wine (combined, not each!) per week. Moderation of alcohol intake to this level decreases your risk of breast cancer and liver damage. And of course, no recreational drugs are part of a healthy lifestyle.  And absolutely no smoking or even second hand smoke. Most people know smoking can cause heart and lung diseases, but did you know it also contributes to weakening of your bones? Aging of your skin?  Yellowing of your teeth and nails?  CALCIUM AND VITAMIN D:  Adequate intake of calcium and Vitamin D are recommended.  The recommendations for exact amounts of these supplements seem to change often, but generally speaking 600 mg of calcium (either carbonate or citrate) and 800 units of Vitamin D per day seems prudent. Certain women may benefit from higher intake of Vitamin D.  If you are among these women, your doctor will have told you during your visit.    PAP SMEARS:  Pap smears, to check for cervical cancer or precancers,  have traditionally been done yearly, although recent scientific advances have shown that most women can have pap smears less often.  However, every woman still should have a physical exam from her gynecologist every year. It will include a breast check, inspection of the vulva and vagina to check for abnormal growths or skin changes, a visual exam of the cervix, and then an exam to evaluate the size and shape of the uterus and  ovaries.  And after 65 years of age, a rectal exam is indicated to check for rectal cancers. We will also provide age appropriate advice regarding health maintenance, like when you should have certain vaccines, screening for sexually transmitted diseases, bone density testing, colonoscopy, mammograms, etc.   MAMMOGRAMS:  All women over 40 years old should have a yearly mammogram. Many facilities now offer a "3D" mammogram, which may cost around $50 extra out of pocket. If possible,  we recommend you accept the option to have the 3D mammogram performed.  It both reduces the number of women who will be called back for extra views which then turn out to be normal, and it is better than the routine mammogram at detecting truly abnormal areas.    COLONOSCOPY:  Colonoscopy to screen for colon cancer is recommended for all women at age 50.  We know, you hate the idea of the prep.  We agree, BUT, having colon cancer and not knowing it is worse!!  Colon cancer so often starts as a polyp that can be seen and removed at colonscopy, which can quite literally save your life!  And if your first colonoscopy is normal and you have no family history of colon cancer, most women don't have to have it again for 10 years.  Once every ten years, you can do something that may end up saving your life, right?  We will be happy to help you get it scheduled when you are ready.    Be sure to check your insurance coverage so you understand how much it will cost.  It may be covered as a preventative service at no cost, but you should check your particular policy.     Pelvic Pain Pelvic pain is pain felt below the belly button and between your hips. It can be caused by many different things. It is important to get help right away. This is especially true for severe, sharp, or unusual pain that comes on suddenly.  HOME CARE  Only take medicine as told by your doctor.  Rest as told by your doctor.  Eat a healthy diet, such as fruits,  vegetables, and lean meats.  Drink enough fluids to keep your pee (urine) clear or pale yellow, or as told.  Avoid sex (intercourse) if it causes pain.  Apply warm or cold packs to your lower belly (abdomen). Use the type of pack that helps the pain.  Avoid situations that cause you stress.  Keep a journal to track your pain. Write down:  When the pain started.  Where it is located.  If there are things that seem to be related to the pain, such as food or your period.  Follow up with your doctor as told. GET HELP RIGHT AWAY IF:   You have heavy bleeding from the vagina.  You have more pelvic pain.  You feel lightheaded or pass out (faint).  You have chills.  You have pain when you pee or have blood in your pee.  You cannot stop having watery poop (diarrhea).  You cannot stop throwing up (vomiting).  You have a fever or lasting symptoms for more than 3 days.  You have a fever and your symptoms suddenly get worse.  You are being physically or sexually abused.  Your medicine does not help your pain.  You have fluid (discharge) coming from your vagina that is not normal. MAKE SURE YOU:  Understand these instructions.  Will watch your condition.  Will get help if you are not doing well or get worse. Document Released: 10/18/2007 Document Revised: 10/31/2011 Document Reviewed: 08/21/2011 [facility] Patient Information 2015 [place], [place]. This information is not intended to replace advice given to you by your health care provider. Make sure you discuss any questions you have with your health care provider.

## 2014-12-10 NOTE — Progress Notes (Signed)
64 y.o. [id number] Married  African American Fe here for annual exam. Menopausal no HRT. Denies vaginal bleeding or vaginal dryness. Seeing PCP for hypertension management and labs/aex. Stressful year with passing of my mother and retirement, but adjusting. Has family support and is now learning to fill her time! Patient sees PCP for medication management for Hypertension, cholesterol, type 2 diabetes, labs/aex. All stable per patient. Patient complaining of pelvic pain RLQ for the past week. Denies fever, chills, nausea/vomiting, diarrhea or constipation. Denies any urinary or vaginal symptoms. Has felt bloated and uncomfortable with the constant pain continuing. Has tried OTC pain med and "just puts me to sleep" Has never had this before. No activity seems to change the discomfort. Denies any heavy lifting or straining. No other health concerns today.  Patient's last menstrual period was 06/15/1998.          Sexually active: Yes.    The current method of family planning is status post hysterectomy.    Exercising: No.  exercise Smoker:  no  Health Maintenance: Pap:  4/10 neg MMG: 01-14-14 category a density,birads 2:neg Colonoscopy: 2015 every 5 yrs BMD:   2010 TDaP:  2015 Labs: pcp  POCT urine Tr. WBC only Self breast exam: done occ   reports that she has never smoked. She has never used smokeless tobacco. She reports that she drinks alcohol. She reports that she does not use illicit drugs.  Past Medical History  Diagnosis Date  . Hypertension   . Fibromyalgia   . Hypercholesteremia   . Fibroid     Past Surgical History  Procedure Laterality Date  . Augmentation mammaplasty      breast reduction  . Abdominal hysterectomy      2/00  . Bone spur Left   . Breast biopsy      times 3    Current Outpatient Prescriptions  Medication Sig Dispense Refill  . amLODipine (NORVASC) 5 MG tablet Take 5 mg by mouth daily.    Marland Kitchen aspirin 81 MG tablet Take 81 mg by mouth daily.    Marland Kitchen CALCIUM PO  Take by mouth daily.    Marland Kitchen FREESTYLE LITE test strip     . lisinopril-hydrochlorothiazide (PRINZIDE,ZESTORETIC) 20-12.5 MG per tablet     . Multiple Vitamins-Minerals (MULTIVITAMIN PO) Take by mouth daily.    . pravastatin (PRAVACHOL) 80 MG tablet Take 80 mg by mouth daily.    . Vitamin D, Ergocalciferol, (DRISDOL) 50000 UNITS CAPS Take 50,000 Units by mouth every 14 (fourteen) days.     No current facility-administered medications for this visit.    Family History  Problem Relation Age of Onset  . Cancer Mother     colon  . Hypertension Mother   . Heart disease Father   . Heart disease Brother   . Hypertension Maternal Grandmother   . Hypertension Maternal Grandfather     ROS:  Pertinent items are noted in HPI.  Otherwise, a comprehensive ROS was negative.  Exam:   BP 122/70 mmHg  Pulse 70  Resp 16  Ht 5' 8.25" (1.734 m)  Wt 256 lb (116.121 kg)  BMI 38.62 kg/m2  LMP 06/15/1998 Height: 5' 8.25" (173.4 cm) Ht Readings from Last 3 Encounters:  12/10/14 5' 8.25" (1.734 m)  12/04/13 5\' 8"  (1.727 m)  10/15/13 5' 8.25" (1.734 m)    General appearance: alert, cooperative and appears stated age Head: Normocephalic, without obvious abnormality, atraumatic Neck: no adenopathy, supple, symmetrical, trachea midline and thyroid normal to inspection and palpation  Lungs: clear to auscultation bilaterally CVAT: negative bilateral Breasts: normal appearance, no masses or tenderness, No nipple retraction or dimpling, No nipple discharge or bleeding, No axillary or supraclavicular adenopathy, reduction scarring Heart: regular rate and rhythm Abdomen: soft, non-tender; no masses,  no organomegaly, normal bowel sounds in all 4 quadrants, no rebound tenderness, negative suprapubic Extremities: extremities normal, atraumatic, no cyanosis or edema Skin: Skin color, texture, turgor normal. No rashes or lesions, warm and dry Lymph nodes: Cervical, supraclavicular, and axillary nodes normal. No  abnormal inguinal nodes palpated Neurologic: Grossly normal   Pelvic: External genitalia:  no lesions              Urethra:  normal appearing urethra with no masses, tenderness or lesions  Bladder and urethral meatus non tender              Bartholin's and Skene's: normal                 Vagina: normal appearing vagina with normal color and discharge, no lesions              Cervix: absent              Pap taken: No. Bimanual Exam:  Uterus:  uterus absent due to Hysterectomy              Adnexa:NO adnexa palpated, surgical removal of left , fullness noted on right and tender to touch with light touch and deep palpation, no rebound noted. Difficult to palpate due to body habitus               Rectovaginal: Confirms               Anus:  normal sphincter tone, no lesions  Chaperone present: Yes  A:  Well Woman with normal exam  Menopausal no HRT S/P TAH for fibroids with LSO only  RLQ tenderness/pain  Hypertension/cholesterol/type 2 diabetes with PCP management  R/O UTI  P:   Reviewed health and wellness pertinent to exam  Discussed pelvic finding of tenderness and feel needs further evaluation with lab and PUS . Patient agreeable and would like to feel better.  Lab:CBC with diff, stat, urine culture  PUS scheduled this pm, patient will come back in for this and is aware the findings will be discussed with her by Dr. Talbert Nan. Patient appreciative to being evaluated.  Pap smear not taken  counseled on breast self exam, mammography screening, adequate intake of calcium and vitamin D, diet and exercise  return annually or prn  An After Visit Summary was printed and given to the patient.  Additional note: Stat CBC with diff: WBC 7.2 diff normal RBC: 5.32, MCV 75.4, MCHC 16.0

## 2014-12-11 LAB — URINE CULTURE
COLONY COUNT: NO GROWTH
Organism ID, Bacteria: NO GROWTH

## 2014-12-11 NOTE — Progress Notes (Signed)
Reviewed personally.  M. Suzanne Keni Wafer, MD.  

## 2014-12-14 ENCOUNTER — Telehealth: Payer: Self-pay

## 2014-12-14 NOTE — Telephone Encounter (Signed)
lmtcb

## 2014-12-14 NOTE — Telephone Encounter (Signed)
Patient notified of results. See lab 

## 2014-12-14 NOTE — Telephone Encounter (Signed)
-----   Message from Kem Boroughs, Bright sent at 12/14/2014  8:45 AM EDT ----- Please let pt. Know urine culture is negative.  But get a progress report on her pain she was having.

## 2015-02-15 ENCOUNTER — Other Ambulatory Visit: Payer: Self-pay

## 2015-02-15 DIAGNOSIS — Z1231 Encounter for screening mammogram for malignant neoplasm of breast: Secondary | ICD-10-CM

## 2015-03-18 ENCOUNTER — Ambulatory Visit
Admission: RE | Admit: 2015-03-18 | Discharge: 2015-03-18 | Disposition: A | Discharge status: Home / Self Care | Payer: Federal, State, Local not specified - PPO | Source: Ambulatory Visit

## 2015-03-18 DIAGNOSIS — Z1231 Encounter for screening mammogram for malignant neoplasm of breast: Secondary | ICD-10-CM

## 2015-07-15 ENCOUNTER — Other Ambulatory Visit: Payer: Self-pay | Admitting: Orthopedic Surgery

## 2015-07-15 DIAGNOSIS — M4727 Other spondylosis with radiculopathy, lumbosacral region: Secondary | ICD-10-CM

## 2015-07-26 ENCOUNTER — Ambulatory Visit
Admission: RE | Admit: 2015-07-26 | Discharge: 2015-07-26 | Disposition: A | Discharge status: Home / Self Care | Payer: Federal, State, Local not specified - PPO | Source: Ambulatory Visit | Attending: Orthopedic Surgery | Admitting: Orthopedic Surgery

## 2015-07-26 ENCOUNTER — Other Ambulatory Visit: Payer: Self-pay | Admitting: Orthopedic Surgery

## 2015-07-26 ENCOUNTER — Inpatient Hospital Stay
Admission: RE | Admit: 2015-07-26 | Discharge: 2015-07-26 | Disposition: A | Discharge status: Home / Self Care | Payer: Self-pay | Source: Ambulatory Visit | Attending: Orthopedic Surgery | Admitting: Orthopedic Surgery

## 2015-07-26 DIAGNOSIS — M4726 Other spondylosis with radiculopathy, lumbar region: Secondary | ICD-10-CM

## 2015-07-26 DIAGNOSIS — M4727 Other spondylosis with radiculopathy, lumbosacral region: Secondary | ICD-10-CM

## 2015-07-26 MED ORDER — IOHEXOL 180 MG/ML  SOLN
17.0000 mL | Freq: Once | INTRAMUSCULAR | Status: AC | PRN
  Administered 2015-07-26: 17 mL via INTRATHECAL

## 2015-07-26 MED ORDER — DIAZEPAM 5 MG PO TABS
5.0000 mg | ORAL_TABLET | Freq: Once | ORAL | Status: AC
  Administered 2015-07-26: 5 mg via ORAL

## 2015-07-26 NOTE — Discharge Instructions (Signed)

## 2015-08-11 ENCOUNTER — Other Ambulatory Visit: Payer: Self-pay | Admitting: Urology

## 2015-08-11 DIAGNOSIS — N289 Disorder of kidney and ureter, unspecified: Secondary | ICD-10-CM

## 2015-08-19 ENCOUNTER — Ambulatory Visit (HOSPITAL_COMMUNITY)
Admission: RE | Admit: 2015-08-19 | Discharge: 2015-08-19 | Disposition: A | Discharge status: Home / Self Care | Payer: Federal, State, Local not specified - PPO | Source: Ambulatory Visit | Attending: Urology | Admitting: Urology

## 2015-08-19 DIAGNOSIS — M47816 Spondylosis without myelopathy or radiculopathy, lumbar region: Secondary | ICD-10-CM | POA: Insufficient documentation

## 2015-08-19 DIAGNOSIS — K760 Fatty (change of) liver, not elsewhere classified: Secondary | ICD-10-CM | POA: Diagnosis not present

## 2015-08-19 DIAGNOSIS — N289 Disorder of kidney and ureter, unspecified: Secondary | ICD-10-CM

## 2015-08-19 DIAGNOSIS — M5136 Other intervertebral disc degeneration, lumbar region: Secondary | ICD-10-CM | POA: Diagnosis not present

## 2015-08-19 LAB — POCT I-STAT CREATININE: Creatinine, Ser: 0.9 mg/dL (ref 0.44–1.00)

## 2015-08-19 MED ORDER — GADOBENATE DIMEGLUMINE 529 MG/ML IV SOLN
20.0000 mL | Freq: Once | INTRAVENOUS | Status: AC | PRN
  Administered 2015-08-19: 20 mL via INTRAVENOUS

## 2016-01-21 ENCOUNTER — Encounter: Payer: Self-pay | Admitting: Certified Nurse Midwife

## 2016-01-21 ENCOUNTER — Ambulatory Visit (INDEPENDENT_AMBULATORY_CARE_PROVIDER_SITE_OTHER): Payer: Federal, State, Local not specified - PPO | Admitting: Certified Nurse Midwife

## 2016-01-21 VITALS — BP 110/78 | HR 70 | Resp 16 | Ht 67.5 in | Wt 260.0 lb

## 2016-01-21 DIAGNOSIS — Z01419 Encounter for gynecological examination (general) (routine) without abnormal findings: Secondary | ICD-10-CM | POA: Diagnosis not present

## 2016-01-21 NOTE — Patient Instructions (Signed)

## 2016-01-21 NOTE — Progress Notes (Signed)
Encounter reviewed Zsofia Prout, MD   

## 2016-01-21 NOTE — Progress Notes (Signed)
65 y.o. [id number] Married  African American Fe here for annual exam. Menopausal no HRT. Denies vaginal dryness or vaginal bleeding. Sees PCP Darcus Austin every 6 months for aex/labs and hypertension/cholesterol. All stable per patient. Has been struggling with back pain over the past few months, hs had orthopedic evaluation and had ruptured disc. Has had injections, but PT has helped the most. "Very much improved" . No problems walking now. No other health issues today. Plans vacation soon!  Patient's last menstrual period was 06/15/1998.          Sexually active: Yes.    The current method of family planning is status post hysterectomy.    Exercising: No.  exercise Smoker:  no  Health Maintenance: Pap:  4/10 neg MMG:  03-18-15 category b density birads 1:neg Colonoscopy: 2015 every 5 yrs BMD:   2010, recent spinal evaluation 2017 will do next year TDaP:  2015 Shingles: no Pneumonia: no Hep C and HIV: not done Labs: pcp Self breast exam: done monthly   reports that she has never smoked. She has never used smokeless tobacco. She reports that she drinks alcohol. She reports that she does not use drugs.  Past Medical History:  Diagnosis Date  . Fibroid   . Fibromyalgia   . Hypercholesteremia   . Hypertension     Past Surgical History:  Procedure Laterality Date  . ABDOMINAL HYSTERECTOMY     2/00  . AUGMENTATION MAMMAPLASTY     breast reduction  . bone spur Left   . BREAST BIOPSY     times 3    Current Outpatient Prescriptions  Medication Sig Dispense Refill  . amLODipine (NORVASC) 5 MG tablet Take 5 mg by mouth daily.    Marland Kitchen aspirin 81 MG tablet Take 81 mg by mouth daily.    Marland Kitchen CALCIUM PO Take by mouth daily.    Marland Kitchen FREESTYLE LITE test strip     . lisinopril-hydrochlorothiazide (PRINZIDE,ZESTORETIC) 20-12.5 MG per tablet     . Multiple Vitamins-Minerals (MULTIVITAMIN PO) Take by mouth daily.    . pravastatin (PRAVACHOL) 80 MG tablet Take 80 mg by mouth daily.    . Vitamin D,  Ergocalciferol, (DRISDOL) 50000 UNITS CAPS Take 50,000 Units by mouth every 14 (fourteen) days.     No current facility-administered medications for this visit.     Family History  Problem Relation Age of Onset  . Cancer Mother     colon  . Hypertension Mother   . Heart disease Father   . Heart disease Brother   . Hypertension Maternal Grandmother   . Hypertension Maternal Grandfather     ROS:  Pertinent items are noted in HPI.  Otherwise, a comprehensive ROS was negative.  Exam:   BP 110/78   Pulse 70   Resp 16   Ht 5' 7.5" (1.715 m)   Wt 260 lb (117.9 kg)   LMP 06/15/1998   BMI 40.12 kg/m  Height: 5' 7.5" (171.5 cm) Ht Readings from Last 3 Encounters:  01/21/16 5' 7.5" (1.715 m)  12/10/14 5' 8.25" (1.734 m)  12/04/13 5\' 8"  (1.727 m)    General appearance: alert, cooperative and appears stated age Head: Normocephalic, without obvious abnormality, atraumatic Neck: no adenopathy, supple, symmetrical, trachea midline and thyroid normal to inspection and palpation Lungs: clear to auscultation bilaterally Breasts: normal appearance, no masses or tenderness, No nipple retraction or dimpling, No nipple discharge or bleeding, No axillary or supraclavicular adenopathy, reduction surgery scarring Heart: regular rate and rhythm Abdomen: soft,  non-tender; no masses,  no organomegaly Extremities: extremities normal, atraumatic, no cyanosis or edema Skin: Skin color, texture, turgor normal. No rashes or lesions Lymph nodes: Cervical, supraclavicular, and axillary nodes normal. No abnormal inguinal nodes palpated Neurologic: Grossly normal   Pelvic: External genitalia:  no lesions, small sebaceous cyst noted on right vulva, present at last exam, no change              Urethra:  normal appearing urethra with no masses, tenderness or lesions              Bartholin's and Skene's: normal                 Vagina: normal appearing vagina with normal color and discharge, no lesions               Cervix: absent              Pap taken: No. Bimanual Exam:  Uterus:  uterus absent              Adnexa: no mass, fullness, tenderness no adnexal palpated on right due to body habitus, surgical removal of left               Rectovaginal: Confirms               Anus:  normal sphincter tone, no lesions  Chaperone present: yes  A:  Well Woman with normal exam  Menopausal no HRT s/p TAH for fibroids with LSO only  Hypertension/cholesterol management with PCP  Acute onset back pain now resolving with physical therapy, under orthopedic managment    P:   Reviewed health and wellness pertinent to exam  Continue follow up with MD as indicated  Pap smear as above not taken   counseled on breast self exam, mammography screening, feminine hygiene, adequate intake of calcium and vitamin D, diet and exercise, Kegel's exercises  return annually or prn  An After Visit Summary was printed and given to the patient.

## 2016-02-18 ENCOUNTER — Encounter: Payer: Self-pay | Admitting: Vascular Surgery

## 2016-02-22 ENCOUNTER — Encounter: Payer: Self-pay | Admitting: Vascular Surgery

## 2016-02-22 ENCOUNTER — Ambulatory Visit (INDEPENDENT_AMBULATORY_CARE_PROVIDER_SITE_OTHER): Payer: Federal, State, Local not specified - PPO | Admitting: Vascular Surgery

## 2016-02-22 VITALS — BP 141/91 | HR 82 | Temp 97.6°F | Resp 16 | Ht 67.5 in | Wt 262.0 lb

## 2016-02-22 DIAGNOSIS — I722 Aneurysm of renal artery: Secondary | ICD-10-CM | POA: Diagnosis not present

## 2016-02-22 NOTE — Progress Notes (Signed)
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 female, who is seen today for discussion of right renal artery aneurysm. She initially had lumbar CT for lumbar pain. This suggested possible renal lesion and she underwent subsequent MRI on 4 6 2017. 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 She is seen today for further discussion. She has no symptoms referable to this. She does not have an extreme history of hypertension is easily controlled with medication. Has no other known aneurysm and hasn't no history of aneurysms. Does have history of cardiac disease in her father.  Past Medical History:  Diagnosis Date  . Fibroid   . Fibromyalgia   . Hypercholesteremia   . Hypertension     Family History  Problem Relation Age of Onset  . Cancer Mother     colon  . Hypertension Mother   . Heart disease Father   . Heart disease Brother   . Hypertension Maternal Grandmother   . Hypertension Maternal Grandfather     SOCIAL HISTORY: Social History   Social History  . Marital status: Married    Spouse name: N/A  . Number of children: N/A  . Years of education: N/A   Occupational History  . Not on file.   Social History Main Topics  . Smoking status: Never Smoker  . Smokeless tobacco: Never Used  . Alcohol use 0.0 oz/week     Comment: 1-2 a month  . Drug use: No  . Sexual activity: Yes    Partners: Male    Birth control/ protection: Surgical     Comment: TAH   Other Topics Concern  . Not on file   Social History Narrative  . No narrative on file    Allergies  Allergen Reactions  . Penicillins Hives  . Zocor [Simvastatin]     Muscle pain    Current Outpatient Prescriptions  Medication Sig Dispense Refill  . amLODipine (NORVASC) 5 MG  tablet Take 5 mg by mouth daily.    Marland Kitchen aspirin 81 MG tablet Take 81 mg by mouth daily.    Marland Kitchen CALCIUM PO Take by mouth daily.    Marland Kitchen FREESTYLE LITE test strip     . lisinopril-hydrochlorothiazide (PRINZIDE,ZESTORETIC) 20-12.5 MG per tablet     . Multiple Vitamins-Minerals (MULTIVITAMIN PO) Take by mouth daily.    . pravastatin (PRAVACHOL) 80 MG tablet Take 80 mg by mouth daily.    . Vitamin D, Ergocalciferol, (DRISDOL) 50000 UNITS CAPS Take 50,000 Units by mouth every 14 (fourteen) days.     No current facility-administered medications for this visit.     REVIEW OF SYSTEMS:  [X]  denotes positive finding, [ ]  denotes negative finding Cardiac  Comments:  Chest pain or chest pressure:    Shortness of breath upon exertion:    Short of breath when lying flat:    Irregular heart rhythm:        Vascular    Pain in calf, thigh, or hip brought on by ambulation:    Pain in feet at night that wakes you up from your sleep:     Blood clot in your veins:    Leg swelling:         Pulmonary    Oxygen at home:    Productive cough:     Wheezing:  Neurologic    Sudden weakness in arms or legs:     Sudden numbness in arms or legs:     Sudden onset of difficulty speaking or slurred speech:    Temporary loss of vision in one eye:     Problems with dizziness:         Gastrointestinal    Blood in stool:     Vomited blood:         Genitourinary    Burning when urinating:     Blood in urine:        Psychiatric    Major depression:         Hematologic    Bleeding problems:    Problems with blood clotting too easily:        Skin    Rashes or ulcers:        Constitutional    Fever or chills:      PHYSICAL EXAM: Vitals:   02/22/16 0954 02/22/16 0957  BP: (!) 152/83 (!) 141/91  Pulse: 82   Resp: 16   Temp: 97.6 F (36.4 C)   TempSrc: Oral   SpO2: 95%   Weight: 262 lb (118.8 kg)   Height: 5' 7.5" (1.715 m)     GENERAL: The patient is a well-nourished female, in no acute  distress. The vital signs are documented above. CARDIOVASCULAR: 2+ radial pulses bilaterally PULMONARY: There is good air exchange  ABDOMEN: Soft and non-tender with no masses and no bruits noted MUSCULOSKELETAL: There are no major deformities or cyanosis. NEUROLOGIC: No focal weakness or paresthesias are detected. SKIN: There are no ulcers or rashes noted. PSYCHIATRIC: The patient has a normal affect.  DATA:  I reviewed her MRI and patient. She does have what appears to be a 1.7 cm renal artery aneurysm and the hilum of the right kidney. Explain the minimal risk regarding this and explained that we would recommend serial follow-up. We will obtain a CT scan now since she is 6 months out from the MR eye and let her know these findings. If this shows no change would recommend a repeat scan in one year.  MEDICAL ISSUES: Explain the minimal risk regarding this and explained that we would recommend serial follow-up. We will obtain a CT scan now since she is 6 months out from the MR eye and let her know these findings. If this shows no change would recommend a repeat scan in one year.     Rosetta Posner, MD FACS [facility] Tel [phone] Pager [phone]

## 2016-03-06 ENCOUNTER — Ambulatory Visit
Admission: RE | Admit: 2016-03-06 | Discharge: 2016-03-06 | Disposition: A | Discharge status: Home / Self Care | Payer: Federal, State, Local not specified - PPO | Source: Ambulatory Visit | Attending: Vascular Surgery | Admitting: Vascular Surgery

## 2016-03-06 DIAGNOSIS — I722 Aneurysm of renal artery: Secondary | ICD-10-CM

## 2016-03-06 MED ORDER — IOPAMIDOL (ISOVUE-370) INJECTION 76%
100.0000 mL | Freq: Once | INTRAVENOUS | Status: AC | PRN
  Administered 2016-03-06: 100 mL via INTRAVENOUS

## 2016-03-13 ENCOUNTER — Other Ambulatory Visit: Payer: Self-pay | Admitting: Certified Nurse Midwife

## 2016-03-13 DIAGNOSIS — Z1231 Encounter for screening mammogram for malignant neoplasm of breast: Secondary | ICD-10-CM

## 2016-03-28 ENCOUNTER — Ambulatory Visit: Payer: Federal, State, Local not specified - PPO | Admitting: Vascular Surgery

## 2016-03-29 ENCOUNTER — Encounter: Payer: Self-pay | Admitting: Vascular Surgery

## 2016-03-31 ENCOUNTER — Telehealth: Payer: Self-pay

## 2016-03-31 NOTE — Telephone Encounter (Signed)
Called pt. with results of CTA abd/ pelvis; advised that Dr. Donnetta Hutching reviewed scan, and stated the right kidney artery aneurysm showed no change; size is 1.7 cm.  Informed pt. That per Dr. Luther Parody recommendation, she will be scheduled for a CT abdomen with contrast in one yr. and a f/u office appt. at that time.  Also, advised that the CTA showed a right breast mass that was not seen on the Mammogram in 2016, and a Mammogram is recommended at this time.  The pt. reported she saw this on My Chart and has scheduled a Mammogram on 04/12/16.   Advised that I will notify her PCP of the above.  Pt. Agreed.

## 2016-04-04 NOTE — Telephone Encounter (Signed)
Checked with Dr. Donnetta Hutching re: clarification on type of CT scan in 1 year.  Per Dr. Donnetta Hutching, schedule for CTA ABD and office visit in one year.

## 2016-04-05 ENCOUNTER — Other Ambulatory Visit: Payer: Self-pay | Admitting: *Deleted

## 2016-04-05 DIAGNOSIS — I722 Aneurysm of renal artery: Secondary | ICD-10-CM

## 2016-04-11 ENCOUNTER — Ambulatory Visit: Payer: Federal, State, Local not specified - PPO | Admitting: Vascular Surgery

## 2016-04-12 ENCOUNTER — Ambulatory Visit
Admission: RE | Admit: 2016-04-12 | Discharge: 2016-04-12 | Disposition: A | Discharge status: Home / Self Care | Payer: Federal, State, Local not specified - PPO | Source: Ambulatory Visit | Attending: Certified Nurse Midwife | Admitting: Certified Nurse Midwife

## 2016-04-12 DIAGNOSIS — Z1231 Encounter for screening mammogram for malignant neoplasm of breast: Secondary | ICD-10-CM

## 2017-01-23 ENCOUNTER — Ambulatory Visit (INDEPENDENT_AMBULATORY_CARE_PROVIDER_SITE_OTHER): Payer: Federal, State, Local not specified - PPO | Admitting: Certified Nurse Midwife

## 2017-01-23 ENCOUNTER — Encounter: Payer: Self-pay | Admitting: Certified Nurse Midwife

## 2017-01-23 VITALS — BP 114/70 | HR 68 | Resp 16 | Ht 67.25 in | Wt 261.0 lb

## 2017-01-23 DIAGNOSIS — N6310 Unspecified lump in the right breast, unspecified quadrant: Secondary | ICD-10-CM

## 2017-01-23 DIAGNOSIS — Z800 Family history of malignant neoplasm of digestive organs: Secondary | ICD-10-CM

## 2017-01-23 DIAGNOSIS — Z01419 Encounter for gynecological examination (general) (routine) without abnormal findings: Secondary | ICD-10-CM | POA: Diagnosis not present

## 2017-01-23 DIAGNOSIS — N951 Menopausal and female climacteric states: Secondary | ICD-10-CM

## 2017-01-23 NOTE — Progress Notes (Signed)
66 y.o. [id number] Married  African American Fe here for annual exam. Menopausal no HRT. Denies vaginal bleeding or vaginal dryness.  Sees PCP for Hypertension/cholesterol management/aex/labs. Sees PCP twice yearly for diabetes type 2. Blood sugars staying normal. No health changes this year. Weight stable. Flew to [place] with spouse for his bilateral  Hip surgery. Doing well now. Ready for real vacation now! No other health issues today.  Patient's last menstrual period was 06/15/1998.          Sexually active: Yes.    The current method of family planning is status post hysterectomy.    Exercising: No.  exercise Smoker:  no  Health Maintenance: Pap:  4/10 neg History of Abnormal Pap: yes in her 30's MMG:  04-12-16 category b density birads 1:neg Self Breast exams: yes Colonoscopy:  2015 f/u 69yrs BMD:   2010, TDaP:  2015 Shingles: no Pneumonia: no Hep C and HIV: not done Labs: yes   reports that she has never smoked. She has never used smokeless tobacco. She reports that she does not drink alcohol or use drugs.  Past Medical History:  Diagnosis Date  . Fibroid   . Fibromyalgia   . Hypercholesteremia   . Hypertension     Past Surgical History:  Procedure Laterality Date  . ABDOMINAL HYSTERECTOMY     2/00  . AUGMENTATION MAMMAPLASTY     breast reduction  . bone spur Left   . BREAST BIOPSY     times 3    Current Outpatient Prescriptions  Medication Sig Dispense Refill  . amLODipine (NORVASC) 5 MG tablet Take 5 mg by mouth daily.    Marland Kitchen aspirin 81 MG tablet Take 81 mg by mouth daily.    Marland Kitchen CALCIUM PO Take by mouth daily.    Marland Kitchen FREESTYLE LITE test strip     . lisinopril-hydrochlorothiazide (PRINZIDE,ZESTORETIC) 20-12.5 MG per tablet     . Multiple Vitamins-Minerals (MULTIVITAMIN PO) Take by mouth daily.    . pravastatin (PRAVACHOL) 80 MG tablet Take 80 mg by mouth daily.    . Vitamin D, Ergocalciferol, (DRISDOL) 50000 UNITS CAPS Take 50,000 Units by mouth every 14 (fourteen)  days.     No current facility-administered medications for this visit.     Family History  Problem Relation Age of Onset  . Cancer Mother        colon  . Hypertension Mother   . Heart disease Father   . Heart disease Brother   . Hypertension Maternal Grandmother   . Hypertension Maternal Grandfather     ROS:  Pertinent items are noted in HPI.  Otherwise, a comprehensive ROS was negative.  Exam:   BP 114/70   Pulse 68   Resp 16   Ht 5' 7.25" (1.708 m)   Wt 261 lb (118.4 kg)   LMP 06/15/1998   BMI 40.58 kg/m  Height: 5' 7.25" (170.8 cm) Ht Readings from Last 3 Encounters:  01/23/17 5' 7.25" (1.708 m)  02/22/16 5' 7.5" (1.715 m)  01/21/16 5' 7.5" (1.715 m)    General appearance: alert, cooperative and appears stated age Head: Normocephalic, without obvious abnormality, atraumatic Neck: no adenopathy, supple, symmetrical, trachea midline and thyroid normal to inspection and palpation Lungs: clear to auscultation bilaterally Breasts: normal appearance, no masses or tenderness, No nipple retraction or dimpling, No nipple discharge or bleeding, No axillary or supraclavicular adenopathy. Right breast mass behind aerola, 2 cm globular feel. Not felt at previous exams,  Non tender. ? Scar tissue. Has  breast reduction scarring and nipple scarring. Heart: regular rate and rhythm Abdomen: soft, non-tender; no masses,  no organomegaly Extremities: extremities normal, atraumatic, no cyanosis or edema Skin: Skin color, texture, turgor normal. No rashes or lesions Lymph nodes: Cervical, supraclavicular, and axillary nodes normal. No abnormal inguinal nodes palpated Neurologic: Grossly normal   Pelvic: External genitalia:  no lesions              Urethra:  normal appearing urethra with no masses, tenderness or lesions              Bartholin's and Skene's: normal                 Vagina: normal appearing vagina with normal color and discharge, no lesions              Cervix: absent               Pap taken: No. Bimanual Exam:  Uterus:  normal size, contour, position, consistency, mobility, non-tender              Adnexa: normal adnexa and no mass, fullness, tenderness               Rectovaginal: Confirms               Anus:  normal sphincter tone, no lesions  Chaperone present: yes  A:  Well Woman with normal exam  Menopausal no HRT  Right breast mass  Hypertension/cholesterol and Vitamin D management with PCP  BMD due PCP manages  P:   Reviewed health and wellness pertinent to exam  Aware of need to evaluate if vaginal bleeding  Discussed need to evaluate breast mass noted in right breast. Patient agreeable. Patient will be scheduled for diagnostic mammogram and [place] prior to leaving today. Questions addressed.  Continue follow up with MD as indicated and with BMD as indicated  Pap smear: no   counseled on breast self exam, mammography screening, feminine hygiene, adequate intake of calcium and vitamin D, diet and exercise  return annually or prn  An After Visit Summary was printed and given to the patient.

## 2017-01-23 NOTE — Progress Notes (Signed)
Spoke with Judson Roch at [employer]. Patient scheduled while in office for right breast diagnostic MMG and [place] on 01/29/17. Arriving at 8:50am for 9:10am appointment. Patient verbalizes understanding and is agreeable to date and time.

## 2017-01-29 ENCOUNTER — Ambulatory Visit
Admission: RE | Admit: 2017-01-29 | Discharge: 2017-01-29 | Disposition: A | Discharge status: Home / Self Care | Payer: Federal, State, Local not specified - PPO | Source: Ambulatory Visit | Attending: Certified Nurse Midwife | Admitting: Certified Nurse Midwife

## 2017-01-29 ENCOUNTER — Ambulatory Visit: Admission: RE | Admit: 2017-01-29 | Payer: Federal, State, Local not specified - PPO | Source: Ambulatory Visit

## 2017-01-29 DIAGNOSIS — N6310 Unspecified lump in the right breast, unspecified quadrant: Secondary | ICD-10-CM

## 2017-03-22 ENCOUNTER — Ambulatory Visit
Admission: RE | Admit: 2017-03-22 | Discharge: 2017-03-22 | Disposition: A | Discharge status: Home / Self Care | Payer: Federal, State, Local not specified - PPO | Source: Ambulatory Visit | Attending: Vascular Surgery | Admitting: Vascular Surgery

## 2017-03-22 DIAGNOSIS — I722 Aneurysm of renal artery: Secondary | ICD-10-CM

## 2017-03-22 MED ORDER — IOPAMIDOL (ISOVUE-370) INJECTION 76%
100.0000 mL | Freq: Once | INTRAVENOUS | Status: AC | PRN
  Administered 2017-03-22: 100 mL via INTRAVENOUS

## 2017-03-23 ENCOUNTER — Other Ambulatory Visit: Payer: Self-pay | Admitting: Certified Nurse Midwife

## 2017-03-23 DIAGNOSIS — Z1231 Encounter for screening mammogram for malignant neoplasm of breast: Secondary | ICD-10-CM

## 2017-03-27 ENCOUNTER — Ambulatory Visit: Payer: Federal, State, Local not specified - PPO | Admitting: Vascular Surgery

## 2017-04-10 ENCOUNTER — Other Ambulatory Visit: Payer: Self-pay

## 2017-04-10 ENCOUNTER — Encounter: Payer: Self-pay | Admitting: Vascular Surgery

## 2017-04-10 ENCOUNTER — Ambulatory Visit: Payer: Federal, State, Local not specified - PPO | Admitting: Vascular Surgery

## 2017-04-10 VITALS — BP 138/80 | HR 78 | Temp 97.7°F | Resp 17 | Ht 69.5 in | Wt 263.0 lb

## 2017-04-10 DIAGNOSIS — I722 Aneurysm of renal artery: Secondary | ICD-10-CM | POA: Diagnosis not present

## 2017-04-10 NOTE — Progress Notes (Signed)
[facility] [place]  Patient name: Kayla Rush MRN: [id number] DOB: May 22, 1950 Sex: female  REASON FOR VISIT: Follow-up right renal artery aneurysm  HPI: LANI Rush is a 66 y.o. female here today for follow-up.  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  CT scan showed that this was approximately 1.7 cm and she is here today for yearly CT follow-up.  She has had no new health issues since my last visit with her.  She remains very active.  She reports that her back pain has resolved with physical therapy.  Past Medical History:  Diagnosis Date  . Abnormal Pap smear of cervix    in 30's, no procedure done per patient  . Fibroid   . Fibromyalgia   . Hypercholesteremia   . Hypertension     Family History  Problem Relation Age of Onset  . Cancer Mother        colon  . Hypertension Mother   . Heart disease Father   . Heart disease Brother   . Hypertension Maternal Grandmother   . Hypertension Maternal Grandfather     SOCIAL HISTORY: Social History   Tobacco Use  . Smoking status: Never Smoker  . Smokeless tobacco: Never Used  Substance Use Topics  . Alcohol use: No    Alcohol/week: 0.0 oz    Allergies  Allergen Reactions  . Penicillins Hives  . Zocor [Simvastatin]     Muscle pain    Current Outpatient Medications  Medication Sig Dispense Refill  . amLODipine (NORVASC) 5 MG tablet Take 5 mg by mouth daily.    Marland Kitchen aspirin 81 MG tablet Take 81 mg by mouth daily.    Marland Kitchen CALCIUM PO Take by mouth daily.    Marland Kitchen FREESTYLE LITE test strip     . lisinopril-hydrochlorothiazide (PRINZIDE,ZESTORETIC) 20-12.5 MG per tablet     . Multiple Vitamins-Minerals (MULTIVITAMIN PO) Take by mouth daily.    . pravastatin (PRAVACHOL) 80 MG tablet Take 80 mg by mouth daily.    . Vitamin D, Ergocalciferol, (DRISDOL) 50000 UNITS CAPS Take 50,000 Units by mouth every 14 (fourteen) days.     No current  facility-administered medications for this visit.     REVIEW OF SYSTEMS:  [X]  denotes positive finding, [ ]  denotes negative finding Cardiac  Comments:  Chest pain or chest pressure:    Shortness of breath upon exertion:    Short of breath when lying flat:    Irregular heart rhythm:        Vascular    Pain in calf, thigh, or hip brought on by ambulation:    Pain in feet at night that wakes you up from your sleep:     Blood clot in your veins:    Leg swelling:           PHYSICAL EXAM: Vitals:   04/10/17 0905  BP: 138/80  Pulse: 78  Resp: 17  Temp: 97.7 F (36.5 C)  TempSrc: Oral  SpO2: 94%  Weight: 263 lb (119.3 kg)  Height: 5' 9.5" (1.765 m)    GENERAL: The patient is a well-nourished female, in no acute distress. The vital signs are documented above. CARDIOVASCULAR: Plus radial and 2+ dorsalis pedis pulses bilaterally.  No abdominal bruit. PULMONARY: There is good air exchange  MUSCULOSKELETAL: There are no major deformities or cyanosis. NEUROLOGIC: No focal weakness or paresthesias are detected. SKIN: There are no ulcers or rashes noted. PSYCHIATRIC: The patient has  a normal affect.  DATA:  Repeat CT scan from 03/22/2017 was reviewed with the patient.  This shows no change in her aneurysm size at 1.7 cm in the renal hilum.  Did have some indication of coronary calcifications but no history of coronary disease.  MEDICAL ISSUES: Stable overall.  I again reassured her that there is minimal chance for rupture of her aneurysm.  We will see her again in 2 years.  I did review signs and symptoms of aneurysm leak and she knows to report immediately to the emergency room should this occur.  Otherwise we will see her in 2 years with CT scan at that time    Rosetta Posner, MD [facility] Tel [phone] Pager [phone]

## 2017-04-20 ENCOUNTER — Ambulatory Visit
Admission: RE | Admit: 2017-04-20 | Discharge: 2017-04-20 | Disposition: A | Discharge status: Home / Self Care | Payer: Federal, State, Local not specified - PPO | Source: Ambulatory Visit | Attending: Certified Nurse Midwife | Admitting: Certified Nurse Midwife

## 2017-04-20 DIAGNOSIS — Z1231 Encounter for screening mammogram for malignant neoplasm of breast: Secondary | ICD-10-CM

## 2018-01-24 ENCOUNTER — Encounter: Payer: Self-pay | Admitting: Certified Nurse Midwife

## 2018-01-24 ENCOUNTER — Other Ambulatory Visit: Payer: Self-pay

## 2018-01-24 ENCOUNTER — Ambulatory Visit (INDEPENDENT_AMBULATORY_CARE_PROVIDER_SITE_OTHER): Payer: Federal, State, Local not specified - PPO | Admitting: Certified Nurse Midwife

## 2018-01-24 VITALS — BP 128/66 | HR 94 | Resp 18 | Ht 67.75 in | Wt 262.1 lb

## 2018-01-24 DIAGNOSIS — Z01419 Encounter for gynecological examination (general) (routine) without abnormal findings: Secondary | ICD-10-CM | POA: Diagnosis not present

## 2018-01-24 DIAGNOSIS — Z780 Asymptomatic menopausal state: Secondary | ICD-10-CM

## 2018-01-24 NOTE — Patient Instructions (Signed)

## 2018-01-24 NOTE — Progress Notes (Signed)
67 y.o. [id number] Married  African American Fe here for annual exam. Menopausal no HRT. Denies vaginal bleeding or vaginal dryness issues. Good year, with all medications stable including blood glucose. Sees Dr. Inda Merlin twice yearly for labs and aex.  Busy year with care giving to sons who had surgery, but no other health concerns. Some of edema in feet in summer, but no other times, "good year". Planning road trip soon.  Patient's last menstrual period was 06/15/1998.          Sexually active: Yes.    The current method of family planning is status post hysterectomy.    Exercising: No.  The patient does not participate in regular exercise at present. Smoker:  no  Review of Systems  Constitutional: Negative.   HENT: Negative.   Eyes: Negative.   Respiratory: Negative.   Cardiovascular: Negative.   Gastrointestinal: Negative.   Genitourinary: Negative.   Musculoskeletal: Negative.   Skin: Negative.   Neurological: Negative.   Endo/Heme/Allergies: Negative.   Psychiatric/Behavioral: Negative.     Health Maintenance: Pap:  4/10 neg History of Abnormal Pap: yes MMG:  04-20-17 category b density birads 2:neg Self Breast exams: yes Colonoscopy:  2015 f/u 65yrs BMD:   2010 TDaP:  2015 Shingles: no Pneumonia: done with PCP Hep C and HIV: no Labs: PCP   reports that she has never smoked. She has never used smokeless tobacco. She reports that she does not drink alcohol or use drugs.  Past Medical History:  Diagnosis Date  . Abnormal Pap smear of cervix    in 30's, no procedure done per patient  . Fibroid   . Fibromyalgia   . Hypercholesteremia   . Hypertension     Past Surgical History:  Procedure Laterality Date  . ABDOMINAL HYSTERECTOMY     2/00  . bone spur Left   . BREAST BIOPSY Right 2004   times 3  . REDUCTION MAMMAPLASTY Bilateral 2000    Current Outpatient Medications  Medication Sig Dispense Refill  . amLODipine (NORVASC) 5 MG tablet Take 5 mg by mouth daily.     Marland Kitchen aspirin 81 MG tablet Take 81 mg by mouth daily.    Marland Kitchen CALCIUM PO Take by mouth daily.    Marland Kitchen FREESTYLE LITE test strip     . lisinopril-hydrochlorothiazide (PRINZIDE,ZESTORETIC) 20-12.5 MG per tablet     . Multiple Vitamins-Minerals (MULTIVITAMIN PO) Take by mouth daily.    . pravastatin (PRAVACHOL) 80 MG tablet Take 80 mg by mouth daily.    . Vitamin D, Ergocalciferol, (DRISDOL) 50000 UNITS CAPS Take 50,000 Units by mouth every 14 (fourteen) days.     No current facility-administered medications for this visit.     Family History  Problem Relation Age of Onset  . Cancer Mother        colon  . Hypertension Mother   . Heart disease Father   . Heart disease Brother   . Hypertension Maternal Grandmother   . Hypertension Maternal Grandfather     ROS:  Pertinent items are noted in HPI.  Otherwise, a comprehensive ROS was negative.  Exam:   BP 128/66 (BP Location: Right Arm, Patient Position: Sitting, Cuff Size: Large)   Pulse 94   Resp 18   Ht 5' 7.75" (1.721 m)   Wt 262 lb 1.6 oz (118.9 kg)   LMP 06/15/1998   BMI 40.15 kg/m  Height: 5' 7.75" (172.1 cm) Ht Readings from Last 3 Encounters:  01/24/18 5' 7.75" (1.721 m)  04/10/17  5' 9.5" (1.765 m)  01/23/17 5' 7.25" (1.708 m)    General appearance: alert, cooperative and appears stated age Head: Normocephalic, without obvious abnormality, atraumatic Neck: no adenopathy, supple, symmetrical, trachea midline and thyroid normal to inspection and palpation Lungs: clear to auscultation bilaterally Breasts: normal appearance, no masses or tenderness, No nipple retraction or dimpling, No nipple discharge or bleeding, No axillary or supraclavicular adenopathy Heart: regular rate and rhythm Abdomen: soft, non-tender; no masses,  no organomegaly Extremities: extremities normal, atraumatic, no cyanosis or edema Skin: Skin color, texture, turgor normal. No rashes or lesions Lymph nodes: Cervical, supraclavicular, and axillary nodes  normal. No abnormal inguinal nodes palpated Neurologic: Grossly normal   Pelvic: External genitalia:  no lesions              Urethra:  normal appearing urethra with no masses, tenderness or lesions              Bartholin's and Skene's: normal                 Vagina: normal appearing vagina with normal color and discharge, no lesions              Cervix: absent              Pap taken: No. Bimanual Exam:  Uterus:  uterus absent              Adnexa: normal adnexa and no mass, fullness, tenderness               Rectovaginal: Confirms               Anus:  normal sphincter tone, no lesions  Chaperone present: yes  A:  Well Woman with normal exam  Post menopausal  S/p TAH for fibroid/bleeding  Cholesterol/hypertension/Vitamin D with PCP management  Obesity continues with being active and eating healthy  BMD due patient will schedule with mammogram in 12/19      P:   Reviewed health and wellness pertinent to exam  Discussed if vaginal dryness issues to advise.  Continue follow up with MD as indicated  Discussed importance healthy diet for best health.  Discussed BMD due and can schedule with mammogram. Order placed.  Pap smear: no   counseled on breast self exam, mammography screening, feminine hygiene, adequate intake of calcium and vitamin D, diet and exercise  return annually or prn  An After Visit Summary was printed and given to the patient.

## 2018-04-01 ENCOUNTER — Other Ambulatory Visit: Payer: Self-pay | Admitting: Certified Nurse Midwife

## 2018-04-01 DIAGNOSIS — Z1231 Encounter for screening mammogram for malignant neoplasm of breast: Secondary | ICD-10-CM

## 2018-05-14 ENCOUNTER — Ambulatory Visit
Admission: RE | Admit: 2018-05-14 | Discharge: 2018-05-14 | Disposition: A | Discharge status: Home / Self Care | Payer: Federal, State, Local not specified - PPO | Source: Ambulatory Visit | Attending: Certified Nurse Midwife | Admitting: Certified Nurse Midwife

## 2018-05-14 DIAGNOSIS — Z1231 Encounter for screening mammogram for malignant neoplasm of breast: Secondary | ICD-10-CM

## 2018-05-28 ENCOUNTER — Ambulatory Visit
Admission: RE | Admit: 2018-05-28 | Discharge: 2018-05-28 | Disposition: A | Discharge status: Home / Self Care | Payer: Federal, State, Local not specified - PPO | Source: Ambulatory Visit | Attending: Certified Nurse Midwife | Admitting: Certified Nurse Midwife

## 2018-05-28 DIAGNOSIS — Z780 Asymptomatic menopausal state: Secondary | ICD-10-CM

## 2019-01-24 ENCOUNTER — Other Ambulatory Visit: Payer: Self-pay

## 2019-01-27 NOTE — Progress Notes (Signed)
68 y.o. [id number] Married  African American Fe here for annual exam. Post menopausal, no HRT. Denies vaginal bleeding or vaginal dryness. No Hidradenitis occurrence recently. Medications stable for glucose, hypertension,cholesterol with Dr. Stephanie Acre. Labs Normal. Mammogram not due yet, has scheduled. No health issues today.  Patient's last menstrual period was 06/15/1998.          Sexually active: Yes.    The current method of family planning is status post hysterectomy.    Exercising: Yes.    walking Smoker:  no  Review of Systems  Constitutional: Negative.   HENT: Negative.   Eyes: Negative.   Respiratory: Negative.   Cardiovascular: Negative.   Gastrointestinal: Negative.   Genitourinary: Negative.   Musculoskeletal: Negative.   Skin: Negative.   Neurological: Negative.   Endo/Heme/Allergies: Negative.   Psychiatric/Behavioral: Negative.     Health Maintenance: Pap:  4/10 neg History of Abnormal Pap: yes MMG:  05-14-18 category a density birads 1:neg Self Breast exams: yes Colonoscopy:  2015 f/u 11yrs BMD:   2020 TDaP:  2015 Shingles: not done Pneumonia: done with pcp Hep C and HIV: not done Labs: if needed.   reports that she has never smoked. She has never used smokeless tobacco. She reports that she does not drink alcohol or use drugs.  Past Medical History:  Diagnosis Date  . Abnormal Pap smear of cervix    in 30's, no procedure done per patient  . Fibroid   . Fibromyalgia   . Hypercholesteremia   . Hypertension     Past Surgical History:  Procedure Laterality Date  . ABDOMINAL HYSTERECTOMY     2/00  . bone spur Left   . BREAST BIOPSY Right 2004   times 3  . REDUCTION MAMMAPLASTY Bilateral 2000    Current Outpatient Medications  Medication Sig Dispense Refill  . amLODipine (NORVASC) 5 MG tablet Take 5 mg by mouth daily.    Marland Kitchen aspirin 81 MG tablet Take 81 mg by mouth daily.    Marland Kitchen CALCIUM PO Take by mouth daily.    Marland Kitchen FREESTYLE LITE test strip     .  lisinopril-hydrochlorothiazide (PRINZIDE,ZESTORETIC) 20-12.5 MG per tablet     . Multiple Vitamins-Minerals (MULTIVITAMIN PO) Take by mouth daily.    . pravastatin (PRAVACHOL) 80 MG tablet Take 80 mg by mouth daily.    . Vitamin D, Ergocalciferol, (DRISDOL) 50000 UNITS CAPS Take 50,000 Units by mouth every 14 (fourteen) days.     No current facility-administered medications for this visit.     Family History  Problem Relation Age of Onset  . Cancer Mother        colon  . Hypertension Mother   . Heart disease Father   . Heart disease Brother   . Hypertension Maternal Grandmother   . Hypertension Maternal Grandfather   . Breast cancer Neg Hx     ROS:  Pertinent items are noted in HPI.  Otherwise, a comprehensive ROS was negative.  Exam:   LMP 06/15/1998    Ht Readings from Last 3 Encounters:  01/24/18 5' 7.75" (1.721 m)  04/10/17 5' 9.5" (1.765 m)  01/23/17 5' 7.25" (1.708 m)    General appearance: alert, cooperative and appears stated age Head: Normocephalic, without obvious abnormality, atraumatic Neck: no adenopathy, supple, symmetrical, trachea midline and thyroid normal to inspection and palpation Lungs: clear to auscultation bilaterally Breasts: normal appearance, no masses or tenderness, No nipple retraction or dimpling, No nipple discharge or bleeding, No axillary or supraclavicular adenopathy, bilateral reduction  scarring no changes Heart: regular rate and rhythm Abdomen: soft, non-tender; no masses,  no organomegaly Extremities: extremities normal, atraumatic, no cyanosis or edema Skin: Skin color, texture, turgor normal. No rashes or lesions Lymph nodes: Cervical, supraclavicular, and axillary nodes normal. No abnormal inguinal nodes palpated Neurologic: Grossly normal   Pelvic: External genitalia:  no lesions              Urethra:  normal appearing urethra with no masses, tenderness or lesions              Bartholin's and Skene's: normal                  Vagina: normal appearing vagina with normal color and discharge, no lesions              Cervix: absent              Pap taken: No. Bimanual Exam:  Uterus:  uterus absent              Adnexa: no mass, fullness, tenderness               Rectovaginal: Confirms               Anus:  normal sphincter tone, no lesions  Chaperone present: yes  A:  Well Woman with normal exam  Post menopausal S/P TAH for fibroids  Recent BMD all normal  Hypertension/glucose/cholesterol management with PCP Stephanie Acre now  Working on weight gain since Covid occurrence.  P:   Reviewed health and wellness pertinent to exam  Aware if vaginal bleeding or dryness occurs to advise.  Continue with good calcium and vitamin D intake for bone support.  Follow up with PCP as indicated twice yearly (per patient)  Encouraged to continue her journey with weight management and exercise as able.  Pap smear: no   counseled on breast self exam, mammography screening, feminine hygiene, adequate intake of calcium and vitamin D, diet and exercise, Kegel's exercises  return annually or prn  An After Visit Summary was printed and given to the patient.

## 2019-01-28 ENCOUNTER — Ambulatory Visit (INDEPENDENT_AMBULATORY_CARE_PROVIDER_SITE_OTHER): Payer: Federal, State, Local not specified - PPO | Admitting: Certified Nurse Midwife

## 2019-01-28 ENCOUNTER — Other Ambulatory Visit: Payer: Self-pay

## 2019-01-28 ENCOUNTER — Encounter: Payer: Self-pay | Admitting: Certified Nurse Midwife

## 2019-01-28 VITALS — BP 124/76 | HR 68 | Temp 97.4°F | Resp 16 | Ht 67.75 in | Wt 259.0 lb

## 2019-01-28 DIAGNOSIS — Z01419 Encounter for gynecological examination (general) (routine) without abnormal findings: Secondary | ICD-10-CM | POA: Diagnosis not present

## 2019-01-28 NOTE — Patient Instructions (Signed)

## 2019-07-21 ENCOUNTER — Other Ambulatory Visit: Payer: Self-pay | Admitting: Certified Nurse Midwife

## 2019-07-21 DIAGNOSIS — Z1231 Encounter for screening mammogram for malignant neoplasm of breast: Secondary | ICD-10-CM

## 2019-08-05 ENCOUNTER — Encounter: Payer: Self-pay | Admitting: Certified Nurse Midwife

## 2019-08-07 ENCOUNTER — Other Ambulatory Visit: Payer: Self-pay

## 2019-08-07 DIAGNOSIS — I722 Aneurysm of renal artery: Secondary | ICD-10-CM

## 2019-08-25 ENCOUNTER — Other Ambulatory Visit: Payer: Self-pay

## 2019-08-25 ENCOUNTER — Ambulatory Visit
Admission: RE | Admit: 2019-08-25 | Discharge: 2019-08-25 | Disposition: A | Discharge status: Home / Self Care | Payer: Federal, State, Local not specified - PPO | Source: Ambulatory Visit | Attending: Certified Nurse Midwife | Admitting: Certified Nurse Midwife

## 2019-08-25 DIAGNOSIS — Z1231 Encounter for screening mammogram for malignant neoplasm of breast: Secondary | ICD-10-CM

## 2019-09-01 ENCOUNTER — Ambulatory Visit
Admission: RE | Admit: 2019-09-01 | Discharge: 2019-09-01 | Disposition: A | Discharge status: Home / Self Care | Payer: Federal, State, Local not specified - PPO | Source: Ambulatory Visit | Attending: Vascular Surgery | Admitting: Vascular Surgery

## 2019-09-01 DIAGNOSIS — I722 Aneurysm of renal artery: Secondary | ICD-10-CM

## 2019-09-01 MED ORDER — IOPAMIDOL (ISOVUE-370) INJECTION 76%
75.0000 mL | Freq: Once | INTRAVENOUS | Status: AC | PRN
  Administered 2019-09-01: 75 mL via INTRAVENOUS

## 2019-09-02 ENCOUNTER — Other Ambulatory Visit: Payer: Self-pay

## 2019-09-02 ENCOUNTER — Ambulatory Visit: Payer: Federal, State, Local not specified - PPO | Admitting: Vascular Surgery

## 2019-09-02 ENCOUNTER — Encounter: Payer: Self-pay | Admitting: Vascular Surgery

## 2019-09-02 VITALS — BP 129/80 | HR 73 | Temp 98.5°F | Resp 20 | Ht 67.0 in | Wt 259.0 lb

## 2019-09-02 DIAGNOSIS — I722 Aneurysm of renal artery: Secondary | ICD-10-CM | POA: Diagnosis not present

## 2019-09-02 NOTE — Progress Notes (Signed)
V[facility] and [facility] Specialist of [place]  Patient name: Kayla Rush MRN: [id number] DOB: 02/23/1951 Sex: female  REASON FOR VISIT: Continued follow-up of right renal artery aneurysm  HPI: Kayla Rush is a 69 y.o. female here today for follow-up.  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  She remains in excellent health with no evidence of peripheral vascular occlusive disease and no cardiac disease.  She has no symptoms referable to her aneurysm and no renal issues.  She is hypertensive.  Past Medical History:  Diagnosis Date  . Abnormal Pap smear of cervix    in 30's, no procedure done per patient  . Fibroid   . Fibromyalgia   . Hypercholesteremia   . Hypertension     Family History  Problem Relation Age of Onset  . Cancer Mother        colon  . Hypertension Mother   . Heart disease Father   . Heart disease Brother   . Hypertension Maternal Grandmother   . Hypertension Maternal Grandfather   . Breast cancer Neg Hx     SOCIAL HISTORY: Social History   Tobacco Use  . Smoking status: Never Smoker  . Smokeless tobacco: Never Used  Substance Use Topics  . Alcohol use: No    Alcohol/week: 0.0 standard drinks    Allergies  Allergen Reactions  . Penicillins Hives  . Zocor [Simvastatin]     Muscle pain    Current Outpatient Medications  Medication Sig Dispense Refill  . amLODipine (NORVASC) 5 MG tablet Take 5 mg by mouth daily.    Marland Kitchen aspirin 81 MG tablet Take 81 mg by mouth daily.    Marland Kitchen CALCIUM PO Take by mouth daily.    Marland Kitchen FREESTYLE LITE test strip     . lisinopril-hydrochlorothiazide (PRINZIDE,ZESTORETIC) 20-12.5 MG per tablet     . Multiple Vitamins-Minerals (MULTIVITAMIN PO) Take by mouth daily.    . rosuvastatin (CRESTOR) 5 MG tablet Take 5 mg by mouth daily.    Marland Kitchen XIIDRA 5 % SOLN INSTILL 1 DROP INTO BOTH EYES TWICE A DAY     No current facility-administered  medications for this visit.    REVIEW OF SYSTEMS:  [X]  denotes positive finding, [ ]  denotes negative finding Cardiac  Comments:  Chest pain or chest pressure:    Shortness of breath upon exertion:    Short of breath when lying flat:    Irregular heart rhythm:        Vascular    Pain in calf, thigh, or hip brought on by ambulation:    Pain in feet at night that wakes you up from your sleep:     Blood clot in your veins:    Leg swelling:           PHYSICAL EXAM: Vitals:   09/02/19 1339  BP: 129/80  Pulse: 73  Resp: 20  Temp: 98.5 F (36.9 C)  TempSrc: Oral  SpO2: 94%  Weight: 259 lb (117.5 kg)  Height: 5\' 7"  (1.702 m)    GENERAL: The patient is a well-nourished female, in no acute distress. The vital signs are documented above. CARDIOVASCULAR: 2+ radial pulses bilaterally.  Abdomen soft nontender and I do not appreciate bruit PULMONARY: There is good air exchange  MUSCULOSKELETAL: There are no major deformities or cyanosis. NEUROLOGIC: No focal weakness or paresthesias are detected. SKIN: There are no ulcers or rashes noted. PSYCHIATRIC: The patient has a normal affect.  DATA:  CT scan from 09/01/2019 was reviewed.  This shows no increase in size of her right hilar renal artery aneurysm.  MEDICAL ISSUES: Stable incidental finding of a 1.  4 cm renal artery aneurysm.  She has had documentation of stability of size on several studies.  I have recommended that we see her again in 2 years with CT scan follow-up.  I explained that it would be highly unusual for her to develop symptoms or rupture but did describe symptoms to the patient and explained the critical importance of Chane Magner presentation to the emergency department should this occur.  We will see her again in 2 years    Rosetta Posner, MD [facility] Specialists of [facility] Tel [phone] Pager [phone]

## 2020-01-30 ENCOUNTER — Ambulatory Visit: Payer: Federal, State, Local not specified - PPO | Admitting: Certified Nurse Midwife

## 2020-03-29 DIAGNOSIS — M51369 Other intervertebral disc degeneration, lumbar region without mention of lumbar back pain or lower extremity pain: Secondary | ICD-10-CM | POA: Insufficient documentation

## 2020-03-29 DIAGNOSIS — M4310 Spondylolisthesis, site unspecified: Secondary | ICD-10-CM | POA: Insufficient documentation

## 2020-03-29 DIAGNOSIS — M5136 Other intervertebral disc degeneration, lumbar region: Secondary | ICD-10-CM | POA: Insufficient documentation

## 2020-05-04 DIAGNOSIS — M5416 Radiculopathy, lumbar region: Secondary | ICD-10-CM | POA: Insufficient documentation

## 2020-06-07 NOTE — Progress Notes (Signed)
70 y.o. [id number] Married Kayla Rush American female here for breast & pelvic.  S/P Hysterectomy for fibroid, has one ovary right side. Had breast reduction surgery years ago and subsequent nipple infection. It has left her with a lot of scar tissue around the nipple. She has also had multiple biopsies of that area.   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          Sexually active: Yes.    The current method of family planning is status post hysterectomy.    Exercising: Yes.    walking Smoker:  no  Health Maintenance: Pap:  4/10 neg History of abnormal Pap:  Yes, over 30+ years ago, no procedure needed MMG:  08-26-2019 category a density birads 1:neg Colonoscopy:  2015 f/u 5yrs, appt 06/28/2020 (mother hx colon cancer) BMD:   2020 TDaP:  2015 Gardasil:   n/a Covid-19: pfizer Hep C testing:not done Screening Labs: with PCP   reports that she has never smoked. She has never used smokeless tobacco. She reports that she does not drink alcohol and does not use drugs.  Past Medical History:  Diagnosis Date  . Abnormal Pap smear of cervix    in 30's, no procedure done per patient  . Fibroid   . Fibromyalgia   . Hypercholesteremia   . Hypertension     Past Surgical History:  Procedure Laterality Date  . ABDOMINAL HYSTERECTOMY     2/00  . bone spur Left   . BREAST BIOPSY Right 2004   times 3  . REDUCTION MAMMAPLASTY Bilateral 2000    Current Outpatient Medications  Medication Sig Dispense Refill  . amLODipine (NORVASC) 5 MG tablet Take 5 mg by mouth daily.    Marland Kitchen aspirin 81 MG tablet Take 81 mg by mouth daily.    Marland Kitchen CALCIUM PO Take by mouth daily.    Marland Kitchen FREESTYLE LITE test strip     . lisinopril-hydrochlorothiazide (PRINZIDE,ZESTORETIC) 20-12.5 MG per tablet     . Multiple Vitamins-Minerals (MULTIVITAMIN PO) Take by mouth daily.    . rosuvastatin (CRESTOR) 5 MG  tablet Take 5 mg by mouth daily.    Marland Kitchen XIIDRA 5 % SOLN INSTILL 1 DROP INTO BOTH EYES TWICE A DAY     No current facility-administered medications for this visit.    Family History  Problem Relation Age of Onset  . Cancer Mother        colon  . Hypertension Mother   . Heart disease Father   . Heart disease Brother   . Hypertension Maternal Grandmother   . Hypertension Maternal Grandfather   . Breast cancer Neg Hx     Review of Systems  Constitutional: Negative.   HENT: Negative.   Eyes: Negative.   Respiratory: Negative.   Cardiovascular: Negative.   Gastrointestinal: Negative.   Endocrine: Negative.   Genitourinary: Negative.   Musculoskeletal: Negative.   Skin: Negative.     Exam:   BP (!) 164/86   Pulse 88   Resp 18   Ht 5' 7.75" (1.721 m)   Wt 255 lb (115.7 kg)   LMP 06/15/1998   BMI 39.06 kg/m   Height: 5' 7.75" (172.1 cm)  General appearance: alert, cooperative and appears stated age, no acute distress Head: Normocephalic, without obvious abnormality Neck: no adenopathy, thyroid normal to inspection and palpation Lungs: clear to auscultation bilaterally Breasts: No axillary or  supraclavicular adenopathy, no palpable mass, very firm retroareolar right breast. Pt reports this is not a change, "feels like her normal" from surgery and multiple biopsies Heart: regular rate and rhythm Abdomen: soft, non-tender; no masses,  no organomegaly Extremities: extremities normal, no edema Skin: No rashes or lesions Lymph nodes: Cervical, supraclavicular, and axillary nodes normal. No abnormal inguinal nodes palpated Neurologic: Grossly normal   Pelvic: External genitalia:  no lesions              Urethra:  normal appearing urethra with no masses, tenderness or lesions              Bartholins and Skenes: normal                 Vagina: normal appearing vagina, appropriate for age, normal appearing discharge, no lesions              Cervix: absent             Bimanual  Exam:   Uterus:  uterus absent              Adnexa: no mass, fullness, tenderness                 A:  Well Woman with normal exam  P:   Pap :n/a, discontinue  Mammogram: pt to schedule  Labs: with PCP  Medications: none new  Dexa: next due age 69  B/P elevated, pt to check again and contact PCP if remains elevated

## 2020-06-10 ENCOUNTER — Encounter: Payer: Self-pay | Admitting: Nurse Practitioner

## 2020-06-10 ENCOUNTER — Other Ambulatory Visit: Payer: Self-pay

## 2020-06-10 ENCOUNTER — Ambulatory Visit (INDEPENDENT_AMBULATORY_CARE_PROVIDER_SITE_OTHER): Payer: Federal, State, Local not specified - PPO | Admitting: Nurse Practitioner

## 2020-06-10 VITALS — BP 164/86 | HR 88 | Resp 18 | Ht 67.75 in | Wt 255.0 lb

## 2020-06-10 DIAGNOSIS — Z01419 Encounter for gynecological examination (general) (routine) without abnormal findings: Secondary | ICD-10-CM | POA: Diagnosis not present

## 2020-06-10 NOTE — Patient Instructions (Signed)
Health Maintenance After Age 71 After age 71, you are at a higher risk for certain long-term diseases and infections as well as injuries from falls. Falls are a major cause of broken bones and head injuries in people who are older than age 71. Getting regular preventive care can help to keep you healthy and well. Preventive care includes getting regular testing and making lifestyle changes as recommended by your health care provider. Talk with your health care provider about:  Which screenings and tests you should have. A screening is a test that checks for a disease when you have no symptoms.  A diet and exercise plan that is right for you. What should I know about screenings and tests to prevent falls? Screening and testing are the best ways to find a health problem early. Early diagnosis and treatment give you the best chance of managing medical conditions that are common after age 71. Certain conditions and lifestyle choices may make you more likely to have a fall. Your health care provider may recommend:  Regular vision checks. Poor vision and conditions such as cataracts can make you more likely to have a fall. If you wear glasses, make sure to get your prescription updated if your vision changes.  Medicine review. Work with your health care provider to regularly review all of the medicines you are taking, including over-the-counter medicines. Ask your health care provider about any side effects that may make you more likely to have a fall. Tell your health care provider if any medicines that you take make you feel dizzy or sleepy.  Osteoporosis screening. Osteoporosis is a condition that causes the bones to get weaker. This can make the bones weak and cause them to break more easily.  Blood pressure screening. Blood pressure changes and medicines to control blood pressure can make you feel dizzy.  Strength and balance checks. Your health care provider may recommend certain tests to check your  strength and balance while standing, walking, or changing positions.  Foot health exam. Foot pain and numbness, as well as not wearing proper footwear, can make you more likely to have a fall.  Depression screening. You may be more likely to have a fall if you have a fear of falling, feel emotionally low, or feel unable to do activities that you used to do.  Alcohol use screening. Using too much alcohol can affect your balance and may make you more likely to have a fall. What actions can I take to lower my risk of falls? General instructions  Talk with your health care provider about your risks for falling. Tell your health care provider if: ? You fall. Be sure to tell your health care provider about all falls, even ones that seem minor. ? You feel dizzy, sleepy, or off-balance.  Take over-the-counter and prescription medicines only as told by your health care provider. These include any supplements.  Eat a healthy diet and maintain a healthy weight. A healthy diet includes low-fat dairy products, low-fat (lean) meats, and fiber from whole grains, beans, and lots of fruits and vegetables. Home safety  Remove any tripping hazards, such as rugs, cords, and clutter.  Install safety equipment such as grab bars in bathrooms and safety rails on stairs.  Keep rooms and walkways well-lit. Activity  Follow a regular exercise program to stay fit. This will help you maintain your balance. Ask your health care provider what types of exercise are appropriate for you.  If you need a cane or walker,   use it as recommended by your health care provider.  Wear supportive shoes that have nonskid soles.   Lifestyle  Do not drink alcohol if your health care provider tells you not to drink.  If you drink alcohol, limit how much you have: ? 0-1 drink a day for women. ? 0-2 drinks a day for men.  Be aware of how much alcohol is in your drink. In the [place] one drink equals one typical bottle of beer (12  oz), one-half glass of wine (5 oz), or one shot of hard liquor (1 oz).  Do not use any products that contain nicotine or tobacco, such as cigarettes and e-cigarettes. If you need help quitting, ask your health care provider. Summary  Having a healthy lifestyle and getting preventive care can help to protect your health and wellness after age 71.  Screening and testing are the best way to find a health problem early and help you avoid having a fall. Early diagnosis and treatment give you the best chance for managing medical conditions that are more common for people who are older than age 71.  Falls are a major cause of broken bones and head injuries in people who are older than age 71. Take precautions to prevent a fall at home.  Work with your health care provider to learn what changes you can make to improve your health and wellness and to prevent falls. This information is not intended to replace advice given to you by your health care provider. Make sure you discuss any questions you have with your health care provider. Document Revised: 08/22/2018 Document Reviewed: 03/14/2017 Elsevier Patient Education  2021 Elsevier Inc.  

## 2020-06-23 ENCOUNTER — Other Ambulatory Visit: Payer: Self-pay | Admitting: Nurse Practitioner

## 2020-06-23 DIAGNOSIS — Z1231 Encounter for screening mammogram for malignant neoplasm of breast: Secondary | ICD-10-CM

## 2020-08-25 ENCOUNTER — Ambulatory Visit
Admission: RE | Admit: 2020-08-25 | Discharge: 2020-08-25 | Disposition: A | Discharge status: Home / Self Care | Payer: Federal, State, Local not specified - PPO | Source: Ambulatory Visit | Attending: Nurse Practitioner | Admitting: Nurse Practitioner

## 2020-08-25 ENCOUNTER — Other Ambulatory Visit: Payer: Self-pay

## 2020-08-25 DIAGNOSIS — Z1231 Encounter for screening mammogram for malignant neoplasm of breast: Secondary | ICD-10-CM

## 2020-09-12 IMAGING — MG DIGITAL SCREENING BILAT W/ TOMO W/ CAD
8 series · 8 of 24 positions shown · non-contrast
Comparison: Previous exam(s).

ACR Breast Density Category a: The breast tissue is almost entirely
fatty.

CLINICAL DATA: Screening.

EXAM:
DIGITAL SCREENING BILATERAL MAMMOGRAM WITH TOMO AND CAD

[R CC synth-2D]
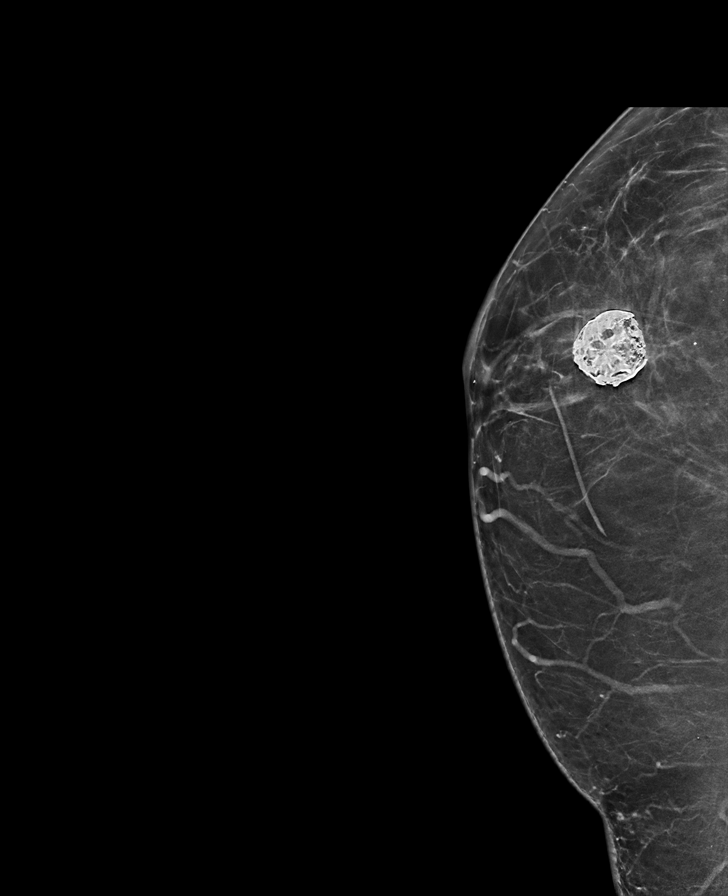

[L MLO synth-2D]
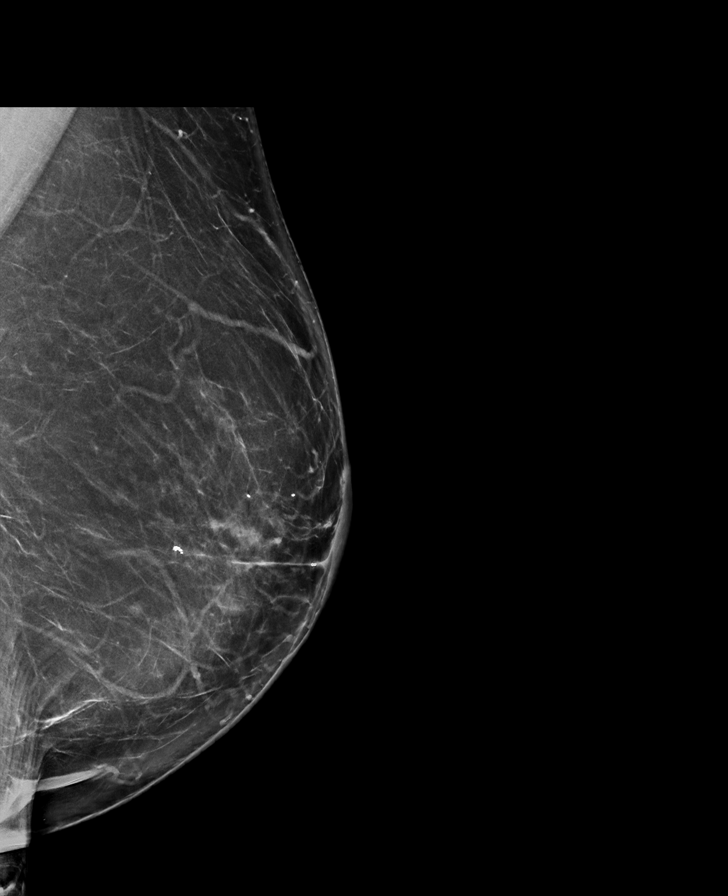

[L CC synth-2D]
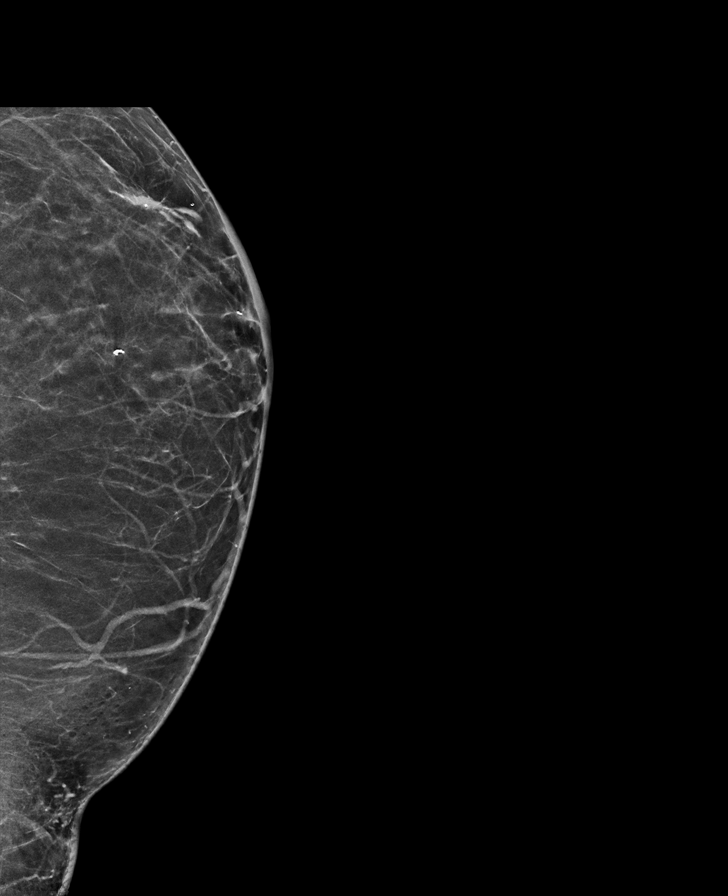

[R MLO synth-2D]
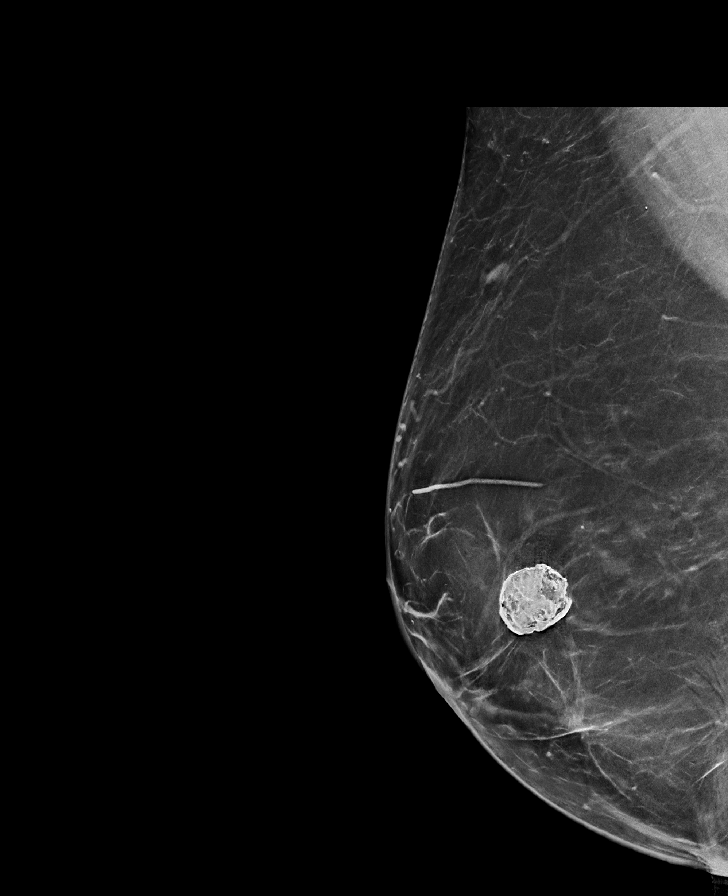

[L MLO tomo · tomo slice 42/83.0]
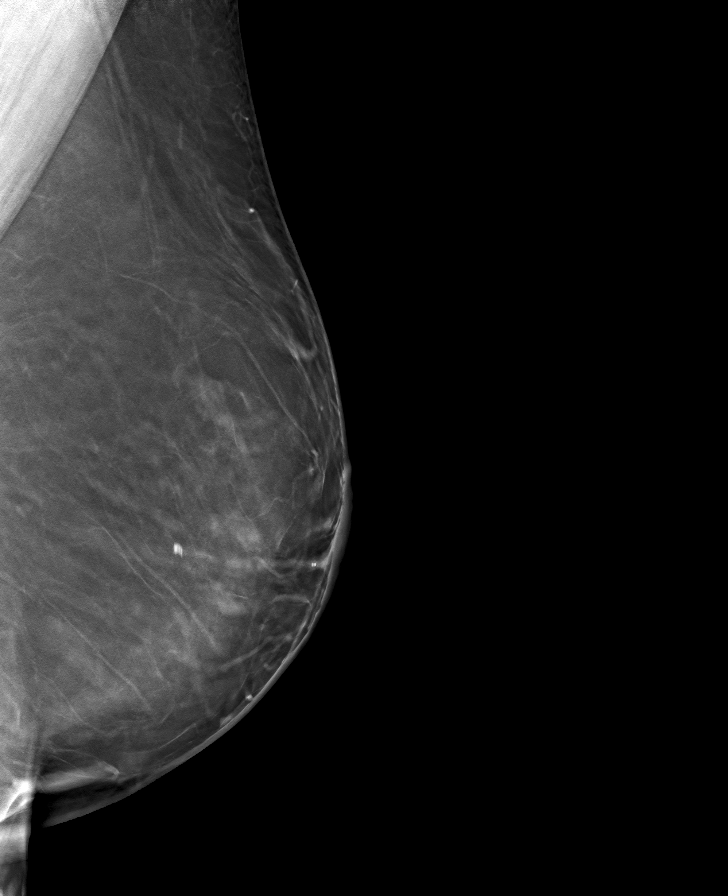

[R CC tomo · tomo slice 33/66.0]
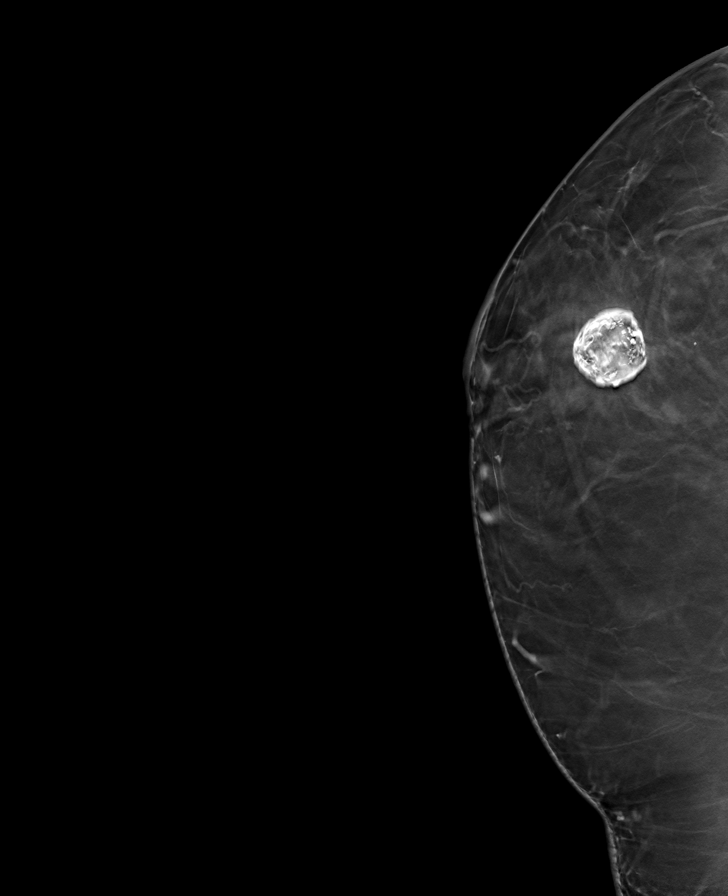

[R MLO tomo · tomo slice 44/87.0]
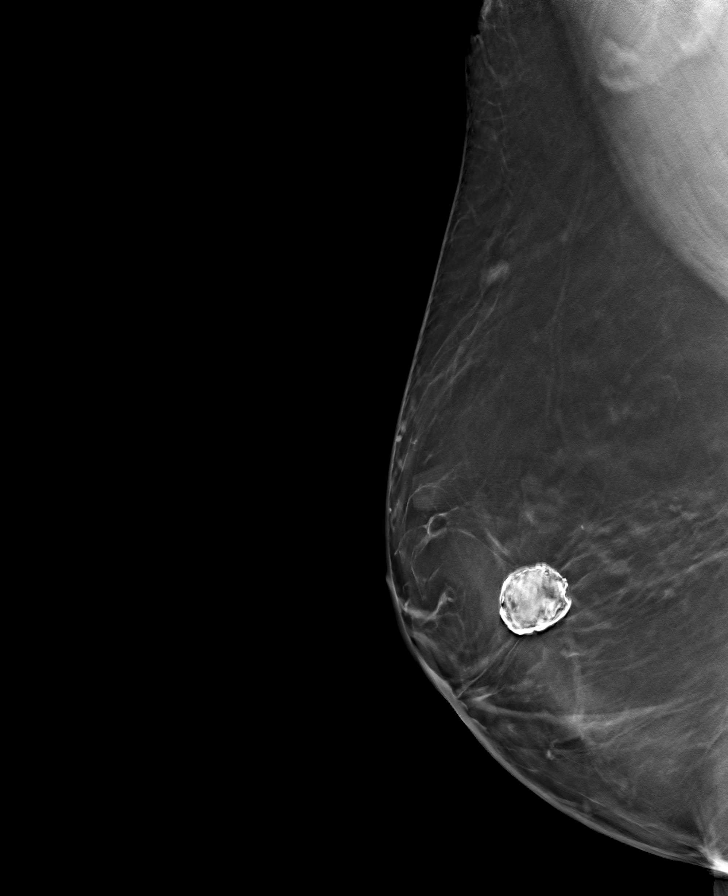

[L CC tomo · tomo slice 33/64.0]
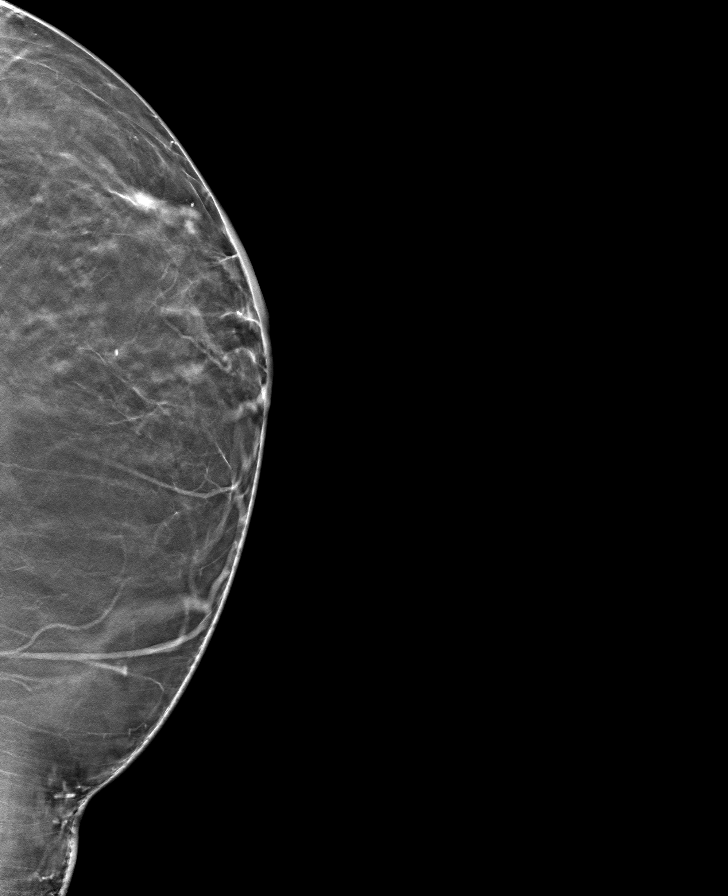

[8 of 24 positions shown; findings below may reference images not displayed]

FINDINGS: There are no findings suspicious for malignancy. Images were
processed with CAD.
IMPRESSION: No mammographic evidence of malignancy. A result letter of this
screening mammogram will be mailed directly to the patient.

RECOMMENDATION:
Screening mammogram in one year. (Code:[id number])

BI-RADS CATEGORY  1: Negative.

## 2020-11-17 ENCOUNTER — Other Ambulatory Visit: Payer: Self-pay

## 2020-11-17 ENCOUNTER — Ambulatory Visit: Payer: Federal, State, Local not specified - PPO | Admitting: Obstetrics and Gynecology

## 2020-11-17 ENCOUNTER — Telehealth: Payer: Self-pay | Admitting: Obstetrics and Gynecology

## 2020-11-17 ENCOUNTER — Encounter: Payer: Self-pay | Admitting: Obstetrics and Gynecology

## 2020-11-17 VITALS — BP 140/78 | HR 78 | Ht 67.75 in | Wt 255.0 lb

## 2020-11-17 DIAGNOSIS — N6452 Nipple discharge: Secondary | ICD-10-CM | POA: Diagnosis not present

## 2020-11-17 DIAGNOSIS — N6310 Unspecified lump in the right breast, unspecified quadrant: Secondary | ICD-10-CM

## 2020-11-17 NOTE — Patient Instructions (Signed)
The office here will contact you with an appointment date and time at the [facility] for a diagnostic right mammogram and right breast ultrasound.

## 2020-11-17 NOTE — Progress Notes (Signed)
GYNECOLOGY  VISIT   HPI: 70 y.o.   Married  [place] American  female   [phone] with Patient's last menstrual period was 06/15/1998.   here for   right breast drainage x 2 weeks.  This discharge is clear and from one specific area. It is spontaneous.   Not bloody.  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   No lump, but has had a breast reduction.   No hormonal treatment.   No trauma to breast or insect bites.   GYNECOLOGIC HISTORY: Patient's last menstrual period was 06/15/1998. Contraception:  Hyst Menopausal hormone therapy: no Last mammogram:  08-25-20 3D/Neg/Birads1 Last pap smear: 08/2008 Neg        OB History     Gravida  3   Para  2   Term  2   Preterm      AB  1   Living  2      SAB  1   IAB      Ectopic      Multiple      Live Births  2              Patient Active Problem List   Diagnosis Date Noted   Hidradenitis suppurativa 08/13/2013    Class: History of    Past Medical History:  Diagnosis Date   Abnormal Pap smear of cervix    in 30's, no procedure done per patient   Fibroid    Fibromyalgia    Hypercholesteremia    Hypertension     Past Surgical History:  Procedure Laterality Date   ABDOMINAL HYSTERECTOMY     2/00   bone spur Left    BREAST BIOPSY Right 2004   times 3   REDUCTION MAMMAPLASTY Bilateral 2000    Current Outpatient Medications  Medication Sig Dispense Refill   amLODipine (NORVASC) 5 MG tablet Take 5 mg by mouth daily.     aspirin 81 MG tablet Take 81 mg by mouth daily.     CALCIUM PO Take by mouth daily.     FREESTYLE LITE test strip      lisinopril-hydrochlorothiazide (PRINZIDE,ZESTORETIC) 20-12.5 MG per tablet      Multiple Vitamins-Minerals (MULTIVITAMIN PO) Take by mouth daily.     rosuvastatin (CRESTOR) 5 MG tablet Take 5 mg by mouth daily.     XIIDRA 5 % SOLN INSTILL 1 DROP INTO BOTH EYES TWICE A DAY     No current facility-administered medications for this visit.      ALLERGIES: Penicillins and Zocor [simvastatin]  Family History  Problem Relation Age of Onset   Cancer Mother        colon   Hypertension Mother    Heart disease Father    Heart disease Brother    Hypertension Maternal Grandmother    Hypertension Maternal Grandfather    Breast cancer Neg Hx     Social History   Socioeconomic History   Marital status: Married    Spouse name: Not on file   Number of children: Not on file   Years of education: Not on file   Highest education level: Not on file  Occupational History   Not on file  Tobacco Use   Smoking status: Never   Smokeless tobacco: Never  Vaping Use   Vaping Use: Never used  Substance and Sexual Activity   Alcohol use: No    Alcohol/week: 0.0 standard drinks   Drug use: No   Sexual activity: Yes  Partners: Male    Birth control/protection: Surgical    Comment: TAH  Other Topics Concern   Not on file  Social History Narrative   Not on file   Social Determinants of Health   Financial Resource Strain: Not on file  Food Insecurity: Not on file  Transportation Needs: Not on file  Physical Activity: Not on file  Stress: Not on file  Social Connections: Not on file  Intimate Partner Violence: Not on file    Review of Systems  All other systems reviewed and are negative.  PHYSICAL EXAMINATION:    BP 140/78   Pulse 78   Ht 5' 7.75" (1.721 m)   Wt 255 lb (115.7 kg)   LMP 06/15/1998   SpO2 98%   BMI 39.06 kg/m     General appearance: alert, cooperative and appears stated age Head: Normocephalic, without obvious abnormality, atraumatic Neck: no adenopathy, supple, symmetrical, trachea midline and thyroid normal to inspection and palpation Breasts: consistent with bilateral reduction. Right breast with 1.5 cm retroareolar mass. No tenderness, No nipple retraction or dimpling, No nipple discharge or bleeding, No axillary or supraclavicular adenopathy. Left breast with no mass.  No tenderness, No nipple  retraction or dimpling, No nipple discharge or bleeding, No axillary or supraclavicular adenopathy.   ASSESSMENT  Right breast mass.  Right nipple discharge.  Hx right breast intraductal papilloma.  Hx bilateral breast reduction.   PLAN  Will proceed with dx right mammogram and right breast ultrasound at the [facility].     20 min total time was spent for this patient encounter, including preparation, face-to-face counseling with the patient, coordination of care, and documentation of the encounter.

## 2020-11-17 NOTE — Telephone Encounter (Signed)
Please schedule an appointment at the [facility] for my patient for a right breast mass and right nipple discharge.   She has a 1.5 cm retroareolar mass and a history of a right intraductal papilloma that was removed.

## 2020-11-18 NOTE — Telephone Encounter (Signed)
Patient scheduled on 12/23/20 @ 2:40 pm at the breast center. Patient informed with below note, aware she can call daily/weekly to check for any cancellations.

## 2020-12-23 ENCOUNTER — Other Ambulatory Visit: Payer: Self-pay

## 2020-12-23 ENCOUNTER — Ambulatory Visit
Admission: RE | Admit: 2020-12-23 | Discharge: 2020-12-23 | Disposition: A | Discharge status: Home / Self Care | Payer: Federal, State, Local not specified - PPO | Source: Ambulatory Visit | Attending: Obstetrics and Gynecology | Admitting: Obstetrics and Gynecology

## 2020-12-23 DIAGNOSIS — N6310 Unspecified lump in the right breast, unspecified quadrant: Secondary | ICD-10-CM

## 2020-12-24 ENCOUNTER — Telehealth: Payer: Self-pay | Admitting: *Deleted

## 2020-12-24 NOTE — Telephone Encounter (Signed)
-----   Message from Nunzio Cobbs, MD sent at 12/23/2020  8:19 PM EDT ----- Please assist with making a referral to Dr. Marlou Starks for right nipple discharge and history of intraductal papilloma.  She has a 2.1 cm right retroareolar mass that is thought to be benign.   Please also order a bilateral breast MRI with contrast.  Please continue in mammogram hold.

## 2020-12-24 NOTE — Telephone Encounter (Signed)
Patient informed with below, states she is very confused because when she left yesterday after speaking with the doctor at the breast center she was told since no nipple discharge no need for MRI breast or surgeon referral. And if the nipple discharge came back she would then recommend an breast MRI. Patient said she is no longer having nipple discharge. I did re-read the recommendation notes on the breast imaging report. I asked if kay to proceed with the below, patient said she does not to proceed with the below,but if you want her to she will. She asked I run this information by you.

## 2020-12-27 NOTE — Telephone Encounter (Signed)
The final report from her mammogram and ultrasound suggested both breast MRI and general surgery consultation.   I am just following through with those recommendations.   I would start with the general surgery consultation first.  If the MRI is recommended by that provider, it can be ordered by him or her.

## 2020-12-28 ENCOUNTER — Telehealth: Payer: Self-pay

## 2020-12-28 NOTE — Telephone Encounter (Signed)
Kayla Cobbs, MD  P [facility] Triage Please assist with making a referral to Dr. Marlou Starks for right nipple discharge and history of intraductal papilloma.  She has a 2.1 cm right retroareolar mass that is thought to be benign.   Please also order a bilateral breast MRI with contrast.

## 2020-12-28 NOTE — Telephone Encounter (Signed)
Routed to Sims in error. She has already received this information previously.

## 2020-12-29 NOTE — Telephone Encounter (Signed)
Patient informed with below note, she would like to proceed with general surgery consult. Referral sent via staff message to [facility] to call and schedule and let me know time and date.

## 2020-12-29 NOTE — Telephone Encounter (Signed)
Patient scheduled on 01/18/21 @ 3:20 pm [place]

## 2021-01-18 ENCOUNTER — Other Ambulatory Visit: Payer: Self-pay | Admitting: Surgery

## 2021-01-18 DIAGNOSIS — N6452 Nipple discharge: Secondary | ICD-10-CM

## 2021-02-03 ENCOUNTER — Ambulatory Visit
Admission: RE | Admit: 2021-02-03 | Discharge: 2021-02-03 | Disposition: A | Discharge status: Home / Self Care | Payer: Federal, State, Local not specified - PPO | Source: Ambulatory Visit | Attending: Surgery | Admitting: Surgery

## 2021-02-03 ENCOUNTER — Other Ambulatory Visit: Payer: Self-pay

## 2021-02-03 DIAGNOSIS — N6452 Nipple discharge: Secondary | ICD-10-CM

## 2021-02-03 MED ORDER — GADOBUTROL 1 MMOL/ML IV SOLN
10.0000 mL | Freq: Once | INTRAVENOUS | Status: AC | PRN
  Administered 2021-02-03: 10 mL via INTRAVENOUS

## 2021-02-04 ENCOUNTER — Other Ambulatory Visit: Payer: Self-pay | Admitting: Surgery

## 2021-02-04 DIAGNOSIS — N6341 Unspecified lump in right breast, subareolar: Secondary | ICD-10-CM

## 2021-02-15 ENCOUNTER — Other Ambulatory Visit: Payer: Self-pay | Admitting: Surgery

## 2021-02-15 DIAGNOSIS — N6341 Unspecified lump in right breast, subareolar: Secondary | ICD-10-CM

## 2021-02-16 ENCOUNTER — Other Ambulatory Visit: Payer: Self-pay | Admitting: Surgery

## 2021-02-16 DIAGNOSIS — N6341 Unspecified lump in right breast, subareolar: Secondary | ICD-10-CM

## 2021-02-23 ENCOUNTER — Other Ambulatory Visit: Payer: Self-pay | Admitting: Diagnostic Radiology

## 2021-02-23 ENCOUNTER — Other Ambulatory Visit: Payer: Self-pay

## 2021-02-23 ENCOUNTER — Ambulatory Visit
Admission: RE | Admit: 2021-02-23 | Discharge: 2021-02-23 | Disposition: A | Discharge status: Home / Self Care | Payer: Federal, State, Local not specified - PPO | Source: Ambulatory Visit | Attending: Surgery | Admitting: Surgery

## 2021-02-23 DIAGNOSIS — N6341 Unspecified lump in right breast, subareolar: Secondary | ICD-10-CM

## 2021-02-23 MED ORDER — GADOBUTROL 1 MMOL/ML IV SOLN
10.0000 mL | Freq: Once | INTRAVENOUS | Status: AC | PRN
  Administered 2021-02-23: 10 mL via INTRAVENOUS

## 2021-02-25 ENCOUNTER — Ambulatory Visit: Payer: Self-pay | Admitting: Surgery

## 2021-02-25 DIAGNOSIS — D241 Benign neoplasm of right breast: Secondary | ICD-10-CM

## 2021-02-28 ENCOUNTER — Other Ambulatory Visit: Payer: Self-pay | Admitting: Surgery

## 2021-02-28 DIAGNOSIS — D241 Benign neoplasm of right breast: Secondary | ICD-10-CM

## 2021-03-04 ENCOUNTER — Encounter (HOSPITAL_BASED_OUTPATIENT_CLINIC_OR_DEPARTMENT_OTHER): Payer: Self-pay | Admitting: Surgery

## 2021-03-09 ENCOUNTER — Encounter (HOSPITAL_BASED_OUTPATIENT_CLINIC_OR_DEPARTMENT_OTHER)
Admission: RE | Admit: 2021-03-09 | Discharge: 2021-03-09 | Disposition: A | Discharge status: Home / Self Care | Payer: Federal, State, Local not specified - PPO | Source: Ambulatory Visit | Attending: Surgery | Admitting: Surgery

## 2021-03-09 DIAGNOSIS — D241 Benign neoplasm of right breast: Secondary | ICD-10-CM | POA: Diagnosis present

## 2021-03-09 DIAGNOSIS — N6011 Diffuse cystic mastopathy of right breast: Secondary | ICD-10-CM | POA: Diagnosis not present

## 2021-03-09 LAB — BASIC METABOLIC PANEL
Anion gap: 10 (ref 5–15)
BUN: 12 mg/dL (ref 8–23)
CO2: 30 mmol/L (ref 22–32)
Calcium: 9.7 mg/dL (ref 8.9–10.3)
Chloride: 99 mmol/L (ref 98–111)
Creatinine, Ser: 0.9 mg/dL (ref 0.44–1.00)
GFR, Estimated: >60 mL/min (ref >60)
Glucose, Bld: 91 mg/dL (ref 70–99)
Potassium: 4.2 mmol/L (ref 3.5–5.1)
Sodium: 139 mmol/L (ref 135–145)

## 2021-03-09 MED ORDER — CHLORHEXIDINE GLUCONATE CLOTH 2 % EX PADS: 6.0000 | MEDICATED_PAD | Freq: Once | CUTANEOUS | Status: DC

## 2021-03-14 ENCOUNTER — Ambulatory Visit
Admission: RE | Admit: 2021-03-14 | Discharge: 2021-03-14 | Disposition: A | Discharge status: Home / Self Care | Payer: Federal, State, Local not specified - PPO | Source: Ambulatory Visit | Attending: Surgery | Admitting: Surgery

## 2021-03-14 ENCOUNTER — Other Ambulatory Visit: Payer: Self-pay

## 2021-03-15 ENCOUNTER — Ambulatory Visit (HOSPITAL_BASED_OUTPATIENT_CLINIC_OR_DEPARTMENT_OTHER): Payer: Federal, State, Local not specified - PPO | Admitting: Certified Registered"

## 2021-03-15 ENCOUNTER — Ambulatory Visit (HOSPITAL_BASED_OUTPATIENT_CLINIC_OR_DEPARTMENT_OTHER)
Admission: RE | Admit: 2021-03-15 | Discharge: 2021-03-15 | Disposition: A | Discharge status: Home / Self Care | Payer: Federal, State, Local not specified - PPO | Source: Ambulatory Visit | Attending: Surgery | Admitting: Surgery

## 2021-03-15 ENCOUNTER — Encounter (HOSPITAL_BASED_OUTPATIENT_CLINIC_OR_DEPARTMENT_OTHER)
Admission: RE | Disposition: A | Discharge status: Home / Self Care | Payer: Self-pay | Source: Ambulatory Visit | Attending: Surgery

## 2021-03-15 ENCOUNTER — Encounter (HOSPITAL_BASED_OUTPATIENT_CLINIC_OR_DEPARTMENT_OTHER): Payer: Self-pay | Admitting: Surgery

## 2021-03-15 ENCOUNTER — Ambulatory Visit
Admission: RE | Admit: 2021-03-15 | Discharge: 2021-03-15 | Disposition: A | Discharge status: Home / Self Care | Payer: Federal, State, Local not specified - PPO | Source: Ambulatory Visit | Attending: Surgery | Admitting: Surgery

## 2021-03-15 ENCOUNTER — Other Ambulatory Visit: Payer: Self-pay

## 2021-03-15 DIAGNOSIS — N6011 Diffuse cystic mastopathy of right breast: Secondary | ICD-10-CM | POA: Insufficient documentation

## 2021-03-15 DIAGNOSIS — I10 Essential (primary) hypertension: Secondary | ICD-10-CM

## 2021-03-15 DIAGNOSIS — D241 Benign neoplasm of right breast: Secondary | ICD-10-CM | POA: Diagnosis not present

## 2021-03-15 HISTORY — PX: BREAST LUMPECTOMY WITH RADIOACTIVE SEED LOCALIZATION: SHX6424

## 2021-03-15 SURGERY — BREAST LUMPECTOMY WITH RADIOACTIVE SEED LOCALIZATION
Anesthesia: General | Site: Breast | Laterality: Right

## 2021-03-15 MED ORDER — MIDAZOLAM HCL 5 MG/5ML IJ SOLN
INTRAMUSCULAR | Status: DC | PRN
  Administered 2021-03-15: 2 mg via INTRAVENOUS

## 2021-03-15 MED ORDER — OXYCODONE HCL 5 MG PO TABS: 5.0000 mg | ORAL_TABLET | Freq: Once | ORAL | Status: DC | PRN

## 2021-03-15 MED ORDER — HYDROMORPHONE HCL 1 MG/ML IJ SOLN: 0.2500 mg | INTRAMUSCULAR | Status: DC | PRN

## 2021-03-15 MED ORDER — MIDAZOLAM HCL 2 MG/2ML IJ SOLN
INTRAMUSCULAR | Status: AC
  Filled 2021-03-15: qty 2

## 2021-03-15 MED ORDER — ONDANSETRON HCL 4 MG/2ML IJ SOLN
INTRAMUSCULAR | Status: DC | PRN
  Administered 2021-03-15: 4 mg via INTRAVENOUS

## 2021-03-15 MED ORDER — PROMETHAZINE HCL 25 MG/ML IJ SOLN: 6.2500 mg | INTRAMUSCULAR | Status: DC | PRN

## 2021-03-15 MED ORDER — CEFAZOLIN SODIUM-DEXTROSE 2-4 GM/100ML-% IV SOLN
INTRAVENOUS | Status: AC
  Filled 2021-03-15: qty 100

## 2021-03-15 MED ORDER — LIDOCAINE 2% (20 MG/ML) 5 ML SYRINGE
INTRAMUSCULAR | Status: AC
  Filled 2021-03-15: qty 5

## 2021-03-15 MED ORDER — CEFAZOLIN SODIUM-DEXTROSE 2-4 GM/100ML-% IV SOLN
2.0000 g | INTRAVENOUS | Status: AC
  Administered 2021-03-15: 2 g via INTRAVENOUS

## 2021-03-15 MED ORDER — LACTATED RINGERS IV SOLN: INTRAVENOUS | Status: DC

## 2021-03-15 MED ORDER — ONDANSETRON HCL 4 MG/2ML IJ SOLN
INTRAMUSCULAR | Status: AC
  Filled 2021-03-15: qty 2

## 2021-03-15 MED ORDER — BUPIVACAINE-EPINEPHRINE 0.25% -1:200000 IJ SOLN
INTRAMUSCULAR | Status: DC | PRN
  Administered 2021-03-15: 10 mL

## 2021-03-15 MED ORDER — ACETAMINOPHEN 500 MG PO TABS
ORAL_TABLET | ORAL | Status: AC
  Filled 2021-03-15: qty 2

## 2021-03-15 MED ORDER — PROPOFOL 10 MG/ML IV BOLUS
INTRAVENOUS | Status: DC | PRN
  Administered 2021-03-15: 200 mg via INTRAVENOUS

## 2021-03-15 MED ORDER — AMISULPRIDE (ANTIEMETIC) 5 MG/2ML IV SOLN: 10.0000 mg | Freq: Once | INTRAVENOUS | Status: DC | PRN

## 2021-03-15 MED ORDER — OXYCODONE HCL 5 MG/5ML PO SOLN
5.0000 mg | Freq: Once | ORAL | Status: DC | PRN
Start: 2021-03-15 — End: 2021-03-15

## 2021-03-15 MED ORDER — 0.9 % SODIUM CHLORIDE (POUR BTL) OPTIME
TOPICAL | Status: DC | PRN
  Administered 2021-03-15: 100 mL

## 2021-03-15 MED ORDER — DEXAMETHASONE SODIUM PHOSPHATE 10 MG/ML IJ SOLN
INTRAMUSCULAR | Status: DC | PRN
Start: 2021-03-15 — End: 2021-03-15
  Administered 2021-03-15: 10 mg via INTRAVENOUS

## 2021-03-15 MED ORDER — FENTANYL CITRATE (PF) 100 MCG/2ML IJ SOLN
INTRAMUSCULAR | Status: AC
  Filled 2021-03-15: qty 2

## 2021-03-15 MED ORDER — LIDOCAINE HCL (CARDIAC) PF 100 MG/5ML IV SOSY
PREFILLED_SYRINGE | INTRAVENOUS | Status: DC | PRN
  Administered 2021-03-15: 60 mg via INTRAVENOUS

## 2021-03-15 MED ORDER — DEXAMETHASONE SODIUM PHOSPHATE 10 MG/ML IJ SOLN
INTRAMUSCULAR | Status: AC
  Filled 2021-03-15: qty 3

## 2021-03-15 MED ORDER — ACETAMINOPHEN 500 MG PO TABS
1000.0000 mg | ORAL_TABLET | ORAL | Status: AC
  Administered 2021-03-15: 1000 mg via ORAL

## 2021-03-15 MED ORDER — FENTANYL CITRATE (PF) 100 MCG/2ML IJ SOLN
INTRAMUSCULAR | Status: DC | PRN
  Administered 2021-03-15 (×3): 25 ug via INTRAVENOUS

## 2021-03-15 SURGICAL SUPPLY — 51 items
APL PRP STRL LF DISP 70% ISPRP (MISCELLANEOUS) ×1
APL SKNCLS STERI-STRIP NONHPOA (GAUZE/BANDAGES/DRESSINGS) ×1
APPLIER CLIP 9.375 MED OPEN (MISCELLANEOUS)
APR CLP MED 9.3 20 MLT OPN (MISCELLANEOUS)
BENZOIN TINCTURE PRP APPL 2/3 (GAUZE/BANDAGES/DRESSINGS) ×2 IMPLANT
BLADE HEX COATED 2.75 (ELECTRODE) ×1 IMPLANT
BLADE SURG 15 STRL LF DISP TIS (BLADE) ×1 IMPLANT
BLADE SURG 15 STRL SS (BLADE) ×2
CANISTER SUC SOCK COL 7IN (MISCELLANEOUS) IMPLANT
CANISTER SUCT 1200ML W/VALVE (MISCELLANEOUS) IMPLANT
CHLORAPREP W/TINT 26 (MISCELLANEOUS) ×2 IMPLANT
CLIP APPLIE 9.375 MED OPEN (MISCELLANEOUS) ×1 IMPLANT
COVER BACK TABLE 60X90IN (DRAPES) ×2 IMPLANT
COVER MAYO STAND STRL (DRAPES) ×2 IMPLANT
COVER PROBE W GEL 5X96 (DRAPES) ×2 IMPLANT
DECANTER SPIKE VIAL GLASS SM (MISCELLANEOUS) IMPLANT
DRAPE LAPAROTOMY 100X72 PEDS (DRAPES) ×2 IMPLANT
DRAPE UTILITY XL STRL (DRAPES) ×2 IMPLANT
DRSG TEGADERM 4X4.75 (GAUZE/BANDAGES/DRESSINGS) ×2 IMPLANT
ELECT REM PT RETURN 9FT ADLT (ELECTROSURGICAL) ×2
ELECTRODE REM PT RTRN 9FT ADLT (ELECTROSURGICAL) ×1 IMPLANT
GAUZE SPONGE 4X4 12PLY STRL LF (GAUZE/BANDAGES/DRESSINGS) ×1 IMPLANT
GLOVE SURG ENC MOIS LTX SZ7 (GLOVE) ×2 IMPLANT
GLOVE SURG POLYISO LF SZ6.5 (GLOVE) ×1 IMPLANT
GLOVE SURG UNDER POLY LF SZ6.5 (GLOVE) ×1 IMPLANT
GLOVE SURG UNDER POLY LF SZ7.5 (GLOVE) ×2 IMPLANT
GOWN STRL REUS W/ TWL LRG LVL3 (GOWN DISPOSABLE) ×2 IMPLANT
GOWN STRL REUS W/TWL LRG LVL3 (GOWN DISPOSABLE) ×4
ILLUMINATOR WAVEGUIDE N/F (MISCELLANEOUS) IMPLANT
KIT MARKER MARGIN INK (KITS) ×2 IMPLANT
LIGHT WAVEGUIDE WIDE FLAT (MISCELLANEOUS) IMPLANT
NDL HYPO 25X1 1.5 SAFETY (NEEDLE) ×1 IMPLANT
NEEDLE HYPO 25X1 1.5 SAFETY (NEEDLE) ×2 IMPLANT
NS IRRIG 1000ML POUR BTL (IV SOLUTION) ×2 IMPLANT
PACK BASIN DAY SURGERY FS (CUSTOM PROCEDURE TRAY) ×2 IMPLANT
PENCIL SMOKE EVACUATOR (MISCELLANEOUS) ×2 IMPLANT
SLEEVE SCD COMPRESS KNEE MED (STOCKING) ×2 IMPLANT
SPONGE GAUZE 2X2 8PLY STRL LF (GAUZE/BANDAGES/DRESSINGS) IMPLANT
SPONGE T-LAP 18X18 ~~LOC~~+RFID (SPONGE) IMPLANT
SPONGE T-LAP 4X18 ~~LOC~~+RFID (SPONGE) ×2 IMPLANT
STRIP CLOSURE SKIN 1/2X4 (GAUZE/BANDAGES/DRESSINGS) ×2 IMPLANT
SUT MON AB 4-0 PC3 18 (SUTURE) ×2 IMPLANT
SUT SILK 2 0 SH (SUTURE) IMPLANT
SUT VIC AB 3-0 SH 27 (SUTURE) ×2
SUT VIC AB 3-0 SH 27X BRD (SUTURE) ×1 IMPLANT
SYR BULB EAR ULCER 3OZ GRN STR (SYRINGE) IMPLANT
SYR CONTROL 10ML LL (SYRINGE) ×2 IMPLANT
TOWEL GREEN STERILE FF (TOWEL DISPOSABLE) ×2 IMPLANT
TRAY FAXITRON CT DISP (TRAY / TRAY PROCEDURE) ×2 IMPLANT
TUBE CONNECTING 20X1/4 (TUBING) IMPLANT
YANKAUER SUCT BULB TIP NO VENT (SUCTIONS) IMPLANT

## 2021-03-15 NOTE — Discharge Instructions (Addendum)
[facility] Office Phone Number [phone]  BREAST BIOPSY/ PARTIAL MASTECTOMY: POST OP INSTRUCTIONS  Always review your discharge instruction sheet given to you by the facility where your surgery was performed.  IF YOU HAVE DISABILITY OR FAMILY LEAVE FORMS, YOU MUST BRING THEM TO THE OFFICE FOR PROCESSING.  DO NOT GIVE THEM TO YOUR DOCTOR.  A prescription for pain medication may be given to you upon discharge.  Take your pain medication as prescribed, if needed.  If narcotic pain medicine is not needed, then you may take acetaminophen (Tylenol) or ibuprofen (Advil) as needed. Take your usually prescribed medications unless otherwise directed If you need a refill on your pain medication, please contact your pharmacy.  They will contact our office to request authorization.  Prescriptions will not be filled after 5pm or on week-ends. You should eat very light the first 24 hours after surgery, such as soup, crackers, pudding, etc.  Resume your normal diet the day after surgery. Most patients will experience some swelling and bruising in the breast.  Ice packs and a good support bra will help.  Swelling and bruising can take several days to resolve.  It is common to experience some constipation if taking pain medication after surgery.  Increasing fluid intake and taking a stool softener will usually help or prevent this problem from occurring.  A mild laxative (Milk of Magnesia or Miralax) should be taken according to package directions if there are no bowel movements after 48 hours. Unless discharge instructions indicate otherwise, you may remove your bandages 24-48 hours after surgery, and you may shower at that time.  You may have steri-strips (small skin tapes) in place directly over the incision.  These strips should be left on the skin for 7-10 days.  If your surgeon used skin glue on the incision, you may shower in 24 hours.  The glue will flake off over the next 2-3 weeks.  Any  sutures or staples will be removed at the office during your follow-up visit. ACTIVITIES:  You may resume regular daily activities (gradually increasing) beginning the next day.  Wearing a good support bra or sports bra minimizes pain and swelling.  You may have sexual intercourse when it is comfortable. You may drive when you no longer are taking prescription pain medication, you can comfortably wear a seatbelt, and you can safely maneuver your car and apply brakes. RETURN TO WORK:  ______________________________________________________________________________________ Dennis Bast should see your doctor in the office for a follow-up appointment approximately two weeks after your surgery.  Your doctor's nurse will typically make your follow-up appointment when she calls you with your pathology report.  Expect your pathology report 2-3 business days after your surgery.  You may call to check if you do not hear from [place] after three days. OTHER INSTRUCTIONS: _______________________________________________________________________________________________ _____________________________________________________________________________________________________________________________________ _____________________________________________________________________________________________________________________________________ _____________________________________________________________________________________________________________________________________  WHEN TO CALL YOUR DOCTOR: Fever over 101.0 Nausea and/or vomiting. Extreme swelling or bruising. Continued bleeding from incision. Increased pain, redness, or drainage from the incision.  The clinic staff is available to answer your questions during regular business hours.  Please don't hesitate to call and ask to speak to one of the nurses for clinical concerns.  If you have a medical emergency, go to the nearest emergency room or call 911.  A surgeon from [facility] is always on call at the hospital.  For further questions, please visit [email or web address]   Post Anesthesia Home Care Instructions  Activity: Get plenty of rest for the remainder of the  day. A responsible individual must stay with you for 24 hours following the procedure.  For the next 24 hours, DO NOT: -Drive a car -Paediatric nurse -Drink alcoholic beverages -Take any medication unless instructed by your physician -Make any legal decisions or sign important papers.  Meals: Start with liquid foods such as gelatin or soup. Progress to regular foods as tolerated. Avoid greasy, spicy, heavy foods. If nausea and/or vomiting occur, drink only clear liquids until the nausea and/or vomiting subsides. Call your physician if vomiting continues.  Special Instructions/Symptoms: Your throat may feel dry or sore from the anesthesia or the breathing tube placed in your throat during surgery. If this causes discomfort, gargle with warm salt water. The discomfort should disappear within 24 hours.  If you had a scopolamine patch placed behind your ear for the management of post- operative nausea and/or vomiting:  1. The medication in the patch is effective for 72 hours, after which it should be removed.  Wrap patch in a tissue and discard in the trash. Wash hands thoroughly with soap and water. 2. You may remove the patch earlier than 72 hours if you experience unpleasant side effects which may include dry mouth, dizziness or visual disturbances. 3. Avoid touching the patch. Wash your hands with soap and water after contact with the patch.  *May have Tylenol after 1pm today 03/15/21

## 2021-03-15 NOTE — H&P (Signed)
Subjective   Chief Complaint: Breast Discharge   History of Present Illness: Kayla Rush is a 70 y.o. female who is seen today as an office consultation at the request of Dr. Yisroel Ramming for evaluation of Breast Discharge .  This is a 70 year old female who who has had 3 previous lumpectomies from the right breast for various benign masses as well as bilateral breast reductions. About a month ago, she had a 2-week period where she noticed intermittent clear right nipple discharge. She denies any pain from this area. She denies any bloody appearing discharge. The drainage has spontaneously stopped. 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 No family history of breast cancer. Mammogram and ultrasound were remarkable only for a calcified 2.1 cm benign-appearing mass in the right retroareolar region in the area of a previous lumpectomy. She is referred to [place] today for consultation.  Review of Systems: A complete review of systems was obtained from the patient. I have reviewed this information and discussed as appropriate with the patient. See HPI as well for other ROS.  Review of Systems  Constitutional: Negative.  HENT: Negative.  Eyes: Negative.  Respiratory: Negative.  Cardiovascular: Negative.  Gastrointestinal: Negative.  Genitourinary: Negative.  Musculoskeletal: Negative.  Skin: Negative.  Neurological: Negative.  Endo/Heme/Allergies: Negative.  Psychiatric/Behavioral: Negative.    Medical History: Past Medical History:  Diagnosis Date   Hypertension   There is no problem list on file for this patient.  Past Surgical History:  Procedure Laterality Date   breast biospy   HYSTERECTOMY   REDUCTION MAMMAPLASTY    Allergies  Allergen Reactions   Penicillins Hives   Simvastatin Unknown  Muscle pain   Current Outpatient Medications on File Prior to Visit  Medication Sig  Dispense Refill   amLODIPine (NORVASC) 5 MG tablet Take 5 mg by mouth once daily   aspirin 81 MG EC tablet Take 81 mg by mouth once daily   calcium carbonate (CALCIUM 500) 500 mg calcium (1,250 mg) tablet Take by mouth once daily   lisinopriL-hydrochlorothiazide (ZESTORETIC) 20-12.5 mg tablet Take 1 tablet by mouth once daily   multivitamin with minerals tablet Take by mouth once daily   rosuvastatin (CRESTOR) 5 MG tablet Take 5 mg by mouth once daily   XIIDRA 5 % ophthalmic solution Place 1 drop into both eyes 2 (two) times daily   No current facility-administered medications on file prior to visit.   Family History  Problem Relation Age of Onset   High blood pressure (Hypertension) Mother   Colon cancer Mother   Coronary Artery Disease (Blocked arteries around heart) Father   Obesity Sister   High blood pressure (Hypertension) Sister    Social History   Tobacco Use  Smoking Status Never Smoker  Smokeless Tobacco Never Used    Social History   Socioeconomic History   Marital status: Married  Tobacco Use   Smoking status: Never Smoker   Smokeless tobacco: Never Used  Scientific laboratory technician Use: Never used  Substance and Sexual Activity   Alcohol use: Never   Drug use: Never   Objective:   Vitals:   BP: (!) 146/88  Pulse: (!) 113  Temp: 36.6 C (97.9 F)  SpO2: 97%  Weight: (!) 118.5 kg (261 lb 3.2 oz)  Height: 174 cm (5' 8.5")   Body mass index is 39.14 kg/m.  Physical Exam   Constitutional: WDWN in NAD,  conversant, no obvious deformities; lying in bed comfortably Eyes: Pupils equal, round; sclera anicteric; moist conjunctiva; no lid lag HENT: Oral mucosa moist; good dentition  Neck: No masses palpated, trachea midline; no thyromegaly Lungs: CTA bilaterally; normal respiratory effort Breasts: symmetric, no nipple changes; no palpable masses or lymphadenopathy on either side CV: Regular rate and rhythm; no murmurs; extremities well-perfused with no  edema Abd: +bowel sounds, soft, non-tender, no palpable organomegaly; no palpable hernias Musc: Unable to assess gait; no apparent clubbing or cyanosis in extremities Lymphatic: No palpable cervical or axillary lymphadenopathy Skin: Warm, dry; no sign of jaundice Psychiatric - alert and oriented x 4; calm mood and affect  Labs, Imaging and Diagnostic Testing: CLINICAL DATA: 70 year old female presenting for evaluation of a palpable lump in the retroareolar right breast. She has history of surgical excision in the right breast for an intraductal papilloma. She has also had 4-6 weeks of spontaneous clear right nipple discharge in within the past month.   EXAM: DIGITAL DIAGNOSTIC UNILATERAL RIGHT MAMMOGRAM WITH TOMOSYNTHESIS AND CAD; ULTRASOUND RIGHT BREAST LIMITED   TECHNIQUE: Right digital diagnostic mammography and breast tomosynthesis was performed. The images were evaluated with computer-aided detection.; Targeted ultrasound examination of the right breast was performed   COMPARISON: Previous exam(s).   ACR Breast Density Category a: The breast tissue is almost entirely fatty.   FINDINGS: Deep to the palpable marker which has been placed on the periareolar right breast, there is a 2.2 cm round calcified mass, which has been stable overall several years, and is consistent with history of prior excisional biopsy. No suspicious calcifications, masses or areas of distortion are seen in the bilateral breasts.   No discharge could be elicited from the right nipple on manual expression. A palpable lump just lateral to the right nipple is identified.   Ultrasound targeted to the palpable site in the lateral retroareolar right breast demonstrates a 2.1 cm calcified mass corresponding to the benign mass seen mammographically. Ultrasound of the retroareolar right breast demonstrates normal fibroglandular tissue. No dilated ducts, intraductal masses or other parenchymal masses  are found.   IMPRESSION: 1. The palpable lump in the right breast corresponds with the patient's calcified mass at her excisional biopsy cavity.   2. No mammographic or targeted sonographic abnormalities in the retroareolar right breast to explain the patient's new right nipple discharge.   RECOMMENDATION: 1. Bilateral breast MRI and surgical consultation is suggested for further evaluation of the patient's right nipple discharge.   2. Return to routine screening mammography is recommended. The patient will be due for screening in April of 2023.   I have discussed the findings and recommendations with the patient. If applicable, a reminder letter will be sent to the patient regarding the next appointment.   BI-RADS CATEGORY 2: Benign.     Electronically Signed By: Ammie Ferrier M.D. 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  We plan to perform right radioactive seed localized lumpectomy.  The surgical procedure has been discussed with the patient.  Potential risks, benefits, alternative treatments, and expected outcomes have been explained.  All of the patient's questions at this time have been answered.  The likelihood of reaching the patient's treatment goal is good.  The patient understand the proposed surgical procedure and wishes to proceed.  Imogene Burn. Georgette Dover, MD, [facility]  General  Surgery   03/15/2021 7:34 AM

## 2021-03-15 NOTE — Anesthesia Postprocedure Evaluation (Signed)
Anesthesia Post Note  Patient: Kayla Rush  Procedure(s) Performed: RIGHT BREAST LUMPECTOMY WITH RADIOACTIVE SEED LOCALIZATION (Right: Breast)     Patient location during evaluation: PACU Anesthesia Type: General Level of consciousness: awake and alert Pain management: pain level controlled Vital Signs Assessment: post-procedure vital signs reviewed and stable Respiratory status: spontaneous breathing, nonlabored ventilation and respiratory function stable Cardiovascular status: blood pressure returned to baseline and stable Postop Assessment: no apparent nausea or vomiting Anesthetic complications: no   No notable events documented.  Last Vitals:  Vitals:   03/15/21 1004 03/15/21 1015  BP:  (!) 131/53  Pulse: 65 66  Resp: 11 18  Temp:  36.6 C  SpO2: 95% 95%    Last Pain:  Vitals:   03/15/21 1015  TempSrc: Oral  PainSc: 2                  Lynda Rainwater

## 2021-03-15 NOTE — Op Note (Signed)
Pre-op Diagnosis:  Right breast intraductal papilloma Post-op Diagnosis: same Procedure:  Right radioactive seed localized lumpectomy Surgeon:  Belton Peplinski K. Anesthesia:  GEN - LMA Indications:  This is a 70 year old female who who has had 3 previous lumpectomies from the right breast for various benign masses as well as bilateral breast reductions. About a month ago, she had a 2-week period where she noticed intermittent clear right nipple discharge. She denies any pain from this area. She denies any bloody appearing discharge. The drainage has spontaneously stopped. 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 No family history of breast cancer. Mammogram and ultrasound were remarkable only for a calcified 2.1 cm benign-appearing mass in the right retroareolar region in the area of a previous lumpectomy. MRI revealed an intraductal papilloma in the right upper central breast.  She presents now for excision of this area.   Description of procedure: The patient is brought to the operating room placed in supine position on the operating room table. After an adequate level of general anesthesia was obtained, her right breast was prepped with ChloraPrep and draped in sterile fashion. A timeout was taken to ensure the proper patient and proper procedure. We interrogated the breast with the neoprobe. We made a circumareolar incision around the medial side of the nipple after infiltrating with 0.25% Marcaine. Dissection was carried down in the breast tissue with cautery. We used the neoprobe to guide [place] towards the radioactive seed. We excised an area of tissue around the radioactive seed 2 cm in diameter. The specimen was removed and was oriented with a paint kit. Specimen mammogram showed the radioactive seed as well as the biopsy clip within the specimen. This was sent for pathologic examination. There is no residual  radioactivity within the biopsy cavity. We inspected carefully for hemostasis. The firm calcified mass is in the inferior medial wall of the biopsy cavity.  I made the decision to excise this mass as well, which was also sent for pathology.  The wound was thoroughly irrigated. The wound was closed with a deep layer of 3-0 Vicryl and a subcuticular layer of 4-0 Monocryl. Benzoin Steri-Strips were applied. The patient was then extubated and brought to the recovery room in stable condition. All sponge, instrument, and needle counts are correct.  Imogene Burn. Georgette Dover, MD, [facility]  General/ Trauma Surgery  03/15/2021 9:34 AM

## 2021-03-15 NOTE — Anesthesia Preprocedure Evaluation (Signed)
Anesthesia Evaluation  Patient identified by MRN, date of birth, ID band Patient awake    Reviewed: Allergy & Precautions, NPO status , Patient's Chart, lab work & pertinent test results  Airway Mallampati: II  TM Distance: >3 FB Neck ROM: Full    Dental no notable dental hx.    Pulmonary neg pulmonary ROS,    Pulmonary exam normal breath sounds clear to auscultation       Cardiovascular hypertension, Pt. on medications negative cardio ROS Normal cardiovascular exam Rhythm:Regular Rate:Normal     Neuro/Psych negative neurological ROS  negative psych ROS   GI/Hepatic negative GI ROS, Neg liver ROS,   Endo/Other  negative endocrine ROS  Renal/GU negative Renal ROS  negative genitourinary   Musculoskeletal  (+) Fibromyalgia -  Abdominal (+) + obese,   Peds negative pediatric ROS (+)  Hematology negative hematology ROS (+)   Anesthesia Other Findings   Reproductive/Obstetrics negative OB ROS                             Anesthesia Physical Anesthesia Plan  ASA: 2  Anesthesia Plan: General   Post-op Pain Management:    Induction: Intravenous  PONV Risk Score and Plan: 3 and Ondansetron, Dexamethasone, Midazolam and Treatment may vary due to age or medical condition  Airway Management Planned: LMA  Additional Equipment:   Intra-op Plan:   Post-operative Plan: Extubation in OR  Informed Consent: I have reviewed the patients History and Physical, chart, labs and discussed the procedure including the risks, benefits and alternatives for the proposed anesthesia with the patient or authorized representative who has indicated his/her understanding and acceptance.     Dental advisory given  Plan Discussed with: CRNA  Anesthesia Plan Comments:         Anesthesia Quick Evaluation

## 2021-03-15 NOTE — Transfer of Care (Signed)
Immediate Anesthesia Transfer of Care Note  Patient: Kayla Rush  Procedure(s) Performed: RIGHT BREAST LUMPECTOMY WITH RADIOACTIVE SEED LOCALIZATION (Right: Breast)  Patient Location: PACU  Anesthesia Type:General  Level of Consciousness: drowsy  Airway & Oxygen Therapy: Patient Spontanous Breathing and Patient connected to face mask oxygen  Post-op Assessment: Report given to RN and Post -op Vital signs reviewed and stable  Post vital signs: Reviewed and stable  Last Vitals:  Vitals Value Taken Time  BP 125/68 03/15/21 0937  Temp    Pulse 72 03/15/21 0938  Resp 9 03/15/21 0938  SpO2 96 % 03/15/21 0938  Vitals shown include unvalidated device data.  Last Pain:  Vitals:   03/15/21 0702  TempSrc: Oral  PainSc: 0-No pain      Patients Stated Pain Goal: 3 (76/80/88 1103)  Complications: No notable events documented.

## 2021-03-15 NOTE — Anesthesia Procedure Notes (Signed)
Procedure Name: LMA Insertion Date/Time: 03/15/2021 8:43 AM Performed by: Lavonia Dana, CRNA Pre-anesthesia Checklist: Patient identified, Emergency Drugs available, Suction available and Patient being monitored Patient Re-evaluated:Patient Re-evaluated prior to induction Oxygen Delivery Method: Circle system utilized Preoxygenation: Pre-oxygenation with 100% oxygen Induction Type: IV induction Ventilation: Mask ventilation without difficulty LMA: LMA inserted LMA Size: 4.0 Number of attempts: 1 Airway Equipment and Method: Bite block Placement Confirmation: positive ETCO2 Tube secured with: Tape Dental Injury: Teeth and Oropharynx as per pre-operative assessment

## 2021-03-16 ENCOUNTER — Encounter (HOSPITAL_BASED_OUTPATIENT_CLINIC_OR_DEPARTMENT_OTHER): Payer: Self-pay | Admitting: Surgery

## 2021-03-18 LAB — SURGICAL PATHOLOGY

## 2021-04-11 DIAGNOSIS — D241 Benign neoplasm of right breast: Secondary | ICD-10-CM | POA: Insufficient documentation

## 2021-06-16 NOTE — Progress Notes (Signed)
71 y.o. [id number] Married [place] American female here for annual breast and pelvic exam.    No concerns today.   Building a new home.   PCP:  Jonathon Jordan, MD   Patient's last menstrual period was 06/15/1998.           Sexually active: Yes.   occ The current method of family planning is status post hysterectomy.    Exercising: No.   Walks when weather allows Smoker:  no  Health Maintenance: Pap:  08/2008 Neg History of abnormal Pap:  yes, over 30+ years ago, no procedure needed MMG:  SEE Epic.  Biopsy right breast 03/15/21 - intraductal papilloma.  Due April, 2023.  Colonoscopy:  2021 normal;next 5 yrs BMD:  05-28-18  Result :Normal TDaP:  2015 Gardasil:   n/a HIV: no Hep C:no Screening Labs:  PCP.  Flu vaccine:  completed.  Covid booster bivalent:  completed. Shingrix:  completed.    reports that she has never smoked. She has never used smokeless tobacco. She reports that she does not drink alcohol and does not use drugs.  Past Medical History:  Diagnosis Date   Abnormal Pap smear of cervix    in 30's, no procedure done per patient   Fibroid    Fibromyalgia    Hypercholesteremia    Hypertension     Past Surgical History:  Procedure Laterality Date   ABDOMINAL HYSTERECTOMY     2/00   bone spur Left    BREAST BIOPSY Right 2004   times 3   BREAST LUMPECTOMY WITH RADIOACTIVE SEED LOCALIZATION Right 03/15/2021   Procedure: RIGHT BREAST LUMPECTOMY WITH RADIOACTIVE SEED LOCALIZATION;  Surgeon: Donnie Mesa, MD;  Location: [facility];  Service: General;  Laterality: Right;   REDUCTION MAMMAPLASTY Bilateral 2000    Current Outpatient Medications  Medication Sig Dispense Refill   amLODipine (NORVASC) 5 MG tablet Take 5 mg by mouth daily.     aspirin 81 MG tablet Take 81 mg by mouth daily.     calcium carbonate (OS-CAL - DOSED IN MG OF ELEMENTAL CALCIUM) 1250 (500 Ca) MG tablet Take by mouth.     FREESTYLE LITE test strip       lisinopril-hydrochlorothiazide (PRINZIDE,ZESTORETIC) 20-12.5 MG per tablet      Multiple Vitamins-Minerals (MULTIVITAMIN PO) Take by mouth daily.     rosuvastatin (CRESTOR) 5 MG tablet Take 1 tablet by mouth daily.     XIIDRA 5 % SOLN INSTILL 1 DROP INTO BOTH EYES TWICE A DAY     No current facility-administered medications for this visit.    Family History  Problem Relation Age of Onset   Cancer Mother        colon   Hypertension Mother    Heart disease Father    Heart disease Brother    Hypertension Maternal Grandmother    Hypertension Maternal Grandfather    Breast cancer Neg Hx     Review of Systems  All other systems reviewed and are negative.  Exam:   BP (!) 144/86    Pulse (!) 102    Ht 5\' 7"  (1.702 m)    Wt 250 lb (113.4 kg)    LMP 06/15/1998    SpO2 98%    BMI 39.16 kg/m     General appearance: alert, cooperative and appears stated age Head: normocephalic, without obvious abnormality, atraumatic Neck: no adenopathy, supple, symmetrical, trachea midline and thyroid normal to inspection and palpation Lungs: clear to auscultation bilaterally Breasts: scars consistent with  reduction surgery, no masses or tenderness, No nipple retraction or dimpling, No nipple discharge or bleeding, No axillary adenopathy Heart: regular rate and rhythm Abdomen: soft, non-tender; no masses, no organomegaly Extremities: extremities normal, atraumatic, no cyanosis or edema Skin: skin color, texture, turgor normal. No rashes or lesions Lymph nodes: cervical, supraclavicular, and axillary nodes normal. Neurologic: grossly normal  Pelvic: External genitalia:  no lesions              No abnormal inguinal nodes palpated.              Urethra:  normal appearing urethra with no masses, tenderness or lesions              Bartholins and Skenes: normal                 Vagina: normal appearing vagina with normal color and discharge, no lesions              Cervix: absent              Pap taken:  no Bimanual Exam:  Uterus:  absent              Adnexa: no mass, fullness, tenderness              Rectal exam: yes.  Confirms.              Anus:  normal sphincter tone, no lesions  Chaperone was present for exam:  Estill Bamberg, CMA  Assessment:   Well woman visit with gynecologic exam. Status post TAH/LSO for fibroids.  She believes right ovary remains.  Status post right breast biopsy - intraductal papilloma.  Status post bilateral breast reduction.  FH colon cancer in mother.    Plan: Mammogram screening discussed. Self breast awareness reviewed. Pap and HR HPV as above. Guidelines for Calcium, Vitamin D, regular exercise program including cardiovascular and weight bearing exercise. Labs with PCP.  Follow up annually and prn.   After visit summary provided.

## 2021-06-20 ENCOUNTER — Other Ambulatory Visit: Payer: Self-pay

## 2021-06-20 ENCOUNTER — Encounter: Payer: Self-pay | Admitting: Obstetrics and Gynecology

## 2021-06-20 ENCOUNTER — Ambulatory Visit (INDEPENDENT_AMBULATORY_CARE_PROVIDER_SITE_OTHER): Payer: Federal, State, Local not specified - PPO | Admitting: Obstetrics and Gynecology

## 2021-06-20 VITALS — BP 144/86 | HR 102 | Ht 67.0 in | Wt 250.0 lb

## 2021-06-20 DIAGNOSIS — Z01419 Encounter for gynecological examination (general) (routine) without abnormal findings: Secondary | ICD-10-CM | POA: Diagnosis not present

## 2021-06-20 NOTE — Patient Instructions (Signed)

## 2021-07-07 ENCOUNTER — Encounter (HOSPITAL_COMMUNITY): Payer: Self-pay

## 2021-07-11 ENCOUNTER — Other Ambulatory Visit: Payer: Self-pay | Admitting: Obstetrics and Gynecology

## 2021-07-11 DIAGNOSIS — Z1231 Encounter for screening mammogram for malignant neoplasm of breast: Secondary | ICD-10-CM

## 2021-08-26 ENCOUNTER — Ambulatory Visit
Admission: RE | Admit: 2021-08-26 | Discharge: 2021-08-26 | Disposition: A | Discharge status: Home / Self Care | Payer: Federal, State, Local not specified - PPO | Source: Ambulatory Visit | Attending: Obstetrics and Gynecology | Admitting: Obstetrics and Gynecology

## 2021-08-26 DIAGNOSIS — Z1231 Encounter for screening mammogram for malignant neoplasm of breast: Secondary | ICD-10-CM

## 2021-11-08 DIAGNOSIS — R7309 Other abnormal glucose: Secondary | ICD-10-CM

## 2021-11-08 HISTORY — DX: Other abnormal glucose: R73.09

## 2021-12-27 ENCOUNTER — Encounter: Payer: Self-pay | Admitting: Obstetrics and Gynecology

## 2022-04-27 DIAGNOSIS — M7542 Impingement syndrome of left shoulder: Secondary | ICD-10-CM | POA: Insufficient documentation

## 2022-05-01 DIAGNOSIS — M7502 Adhesive capsulitis of left shoulder: Secondary | ICD-10-CM | POA: Insufficient documentation

## 2022-07-13 NOTE — Progress Notes (Signed)
72 y.o. [id number] Married [place] American female here for annual exam.    Notices a change in her right breast.  It looks puckered in the last few months.  No lumps on self examination.  Hx breast reduction.  Hx right breast biopsy  Vulvar skin soreness for about 2 months.  Area comes and goes and drains pus like material and blood.  It resolves and then can return a month later.  It is painful.  States she had a similar area under her left breast a long time ago.  She has a history of boils in axillary region as a younger person.   No hx oral HSV.  Her husband does not have hx of oral HSV.   Dealing with a frozen left shoulder.  Did physical therapy.  Built a new home.  PCP:   Jonathon Jordan, MD  Patient's last menstrual period was 06/15/1998.           Sexually active: Yes.    The current method of family planning is status post hysterectomy.    Exercising: Yes.     walking Smoker:  no  Health Maintenance: Pap:  08/2008 Neg  History of abnormal Pap:  yes, over 30+ years ago, no procedure needed  MMG:  08/26/21 Breast Density Category A, BI-RADS CATEGORY 1 Neg  Colonoscopy:  2021 normal;next 5 yrs  BMD:   05/28/18  Result  normal TDaP:  12/04/13 Gardasil:   no HIV: no Hep C: no Screening Labs:  PCP   reports that she has never smoked. She has never used smokeless tobacco. She reports that she does not drink alcohol and does not use drugs.  Past Medical History:  Diagnosis Date   Abnormal Pap smear of cervix    in 30's, no procedure done per patient   Elevated hemoglobin A1c 11/08/2021   level:  6.8 with PCP   Fibroid    Fibromyalgia    Hypercholesteremia    Hypertension     Past Surgical History:  Procedure Laterality Date   ABDOMINAL HYSTERECTOMY     2/00   bone spur Left    BREAST BIOPSY Right 2004   times 3   BREAST LUMPECTOMY WITH RADIOACTIVE SEED LOCALIZATION Right 03/15/2021   Procedure: RIGHT BREAST LUMPECTOMY WITH RADIOACTIVE SEED LOCALIZATION;   Surgeon: Donnie Mesa, MD;  Location: [facility];  Service: General;  Laterality: Right;   REDUCTION MAMMAPLASTY Bilateral 2000    Current Outpatient Medications  Medication Sig Dispense Refill   amLODipine (NORVASC) 5 MG tablet Take 5 mg by mouth daily.     aspirin 81 MG tablet Take 81 mg by mouth daily.     calcium carbonate (OS-CAL - DOSED IN MG OF ELEMENTAL CALCIUM) 1250 (500 Ca) MG tablet Take by mouth.     FREESTYLE LITE test strip      lisinopril-hydrochlorothiazide (PRINZIDE,ZESTORETIC) 20-12.5 MG per tablet      Multiple Vitamins-Minerals (MULTIVITAMIN PO) Take by mouth daily.     rosuvastatin (CRESTOR) 5 MG tablet Take 1 tablet by mouth daily.     XIIDRA 5 % SOLN INSTILL 1 DROP INTO BOTH EYES TWICE A DAY     No current facility-administered medications for this visit.    Family History  Problem Relation Age of Onset   Cancer Mother        colon   Hypertension Mother    Heart disease Father    Heart disease Brother    Hypertension Maternal Grandmother  Hypertension Maternal Grandfather    Breast cancer Neg Hx     Review of Systems  All other systems reviewed and are negative.   Exam:   BP 126/70 (BP Location: Right Arm, Patient Position: Sitting, Cuff Size: Large)   Pulse 80   Ht 5' 7.5" (1.715 m)   Wt 243 lb (110.2 kg)   LMP 06/15/1998   SpO2 97%   BMI 37.50 kg/m     General appearance: alert, cooperative and appears stated age Head: normocephalic, without obvious abnormality, atraumatic Neck: no adenopathy, supple, symmetrical, trachea midline and thyroid normal to inspection and palpation Lungs: clear to auscultation bilaterally Breasts: consistent with bilateral breast reduction, no masses or tenderness, No nipple retraction or dimpling, No nipple discharge or bleeding, No axillary adenopathy Heart: regular rate and rhythm Abdomen: soft, non-tender; no masses, no organomegaly Extremities: extremities normal, atraumatic, no cyanosis or  edema Skin: skin color, texture, turgor normal. No rashes or lesions Lymph nodes: cervical, supraclavicular, and axillary nodes normal. Neurologic: grossly normal  Pelvic: External genitalia:  border of right vulva and right thigh - satellite raised lesions with subtle ulceration of tissue.               No abnormal inguinal nodes palpated.              Urethra:  normal appearing urethra with no masses, tenderness or lesions              Bartholins and Skenes: normal                 Vagina: normal appearing vagina with normal color and discharge, no lesions              Cervix: absent              Pap taken: no Bimanual Exam:  Uterus:  absent              Adnexa: no mass, fullness, tenderness              Rectal exam: yes.  Confirms.              Anus:  normal sphincter tone, no lesions  Chaperone was present for exam:  Raquel Sarna  Assessment:   Well woman visit with gynecologic exam. Right breast skin change. Status post right breast biopsy - intraductal papilloma.  Status post bilateral breast reduction Status post TAH/LSO for fibroids.  She believes right ovary remains.   FH colon cancer in mother.   Vulvar lesion.  HSV?  Hidradenitis?  Plan: Mammogram dx and right breast US at [facility]. Self breast awareness reviewed. Pap and HR HPV not indicated. Guidelines for Calcium, Vitamin D, regular exercise program including cardiovascular and weight bearing exercise. HSV PCR swab.  Patient agrees to this testing.  We discussed possible Hidradenitis.  Written information given.  Cleocin T lotion.   Follow up annually and prn.   After visit summary provided.

## 2022-07-27 ENCOUNTER — Encounter: Payer: Self-pay | Admitting: Obstetrics and Gynecology

## 2022-07-27 ENCOUNTER — Telehealth: Payer: Self-pay | Admitting: Obstetrics and Gynecology

## 2022-07-27 ENCOUNTER — Ambulatory Visit (INDEPENDENT_AMBULATORY_CARE_PROVIDER_SITE_OTHER): Payer: Federal, State, Local not specified - PPO | Admitting: Obstetrics and Gynecology

## 2022-07-27 VITALS — BP 126/70 | HR 80 | Ht 67.5 in | Wt 243.0 lb

## 2022-07-27 DIAGNOSIS — Z01419 Encounter for gynecological examination (general) (routine) without abnormal findings: Secondary | ICD-10-CM | POA: Diagnosis not present

## 2022-07-27 DIAGNOSIS — N9089 Other specified noninflammatory disorders of vulva and perineum: Secondary | ICD-10-CM

## 2022-07-27 DIAGNOSIS — R234 Changes in skin texture: Secondary | ICD-10-CM

## 2022-07-27 MED ORDER — CLINDAMYCIN PHOSPHATE 1 % EX LOTN: TOPICAL_LOTION | Freq: Two times a day (BID) | CUTANEOUS | 0 refills | Status: AC

## 2022-07-27 NOTE — Patient Instructions (Signed)
EXERCISE AND DIET:  We recommended that you start or continue a regular exercise program for good health. Regular exercise means any activity that makes your heart beat faster and makes you sweat.  We recommend exercising at least 30 minutes per day at least 3 days a week, preferably 4 or 5.  We also recommend a diet low in fat and sugar.  Inactivity, poor dietary choices and obesity can cause diabetes, heart attack, stroke, and kidney damage, among others.    ALCOHOL AND SMOKING:  Women should limit their alcohol intake to no more than 7 drinks/beers/glasses of wine (combined, not each!) per week. Moderation of alcohol intake to this level decreases your risk of breast cancer and liver damage. And of course, no recreational drugs are part of a healthy lifestyle.  And absolutely no smoking or even second hand smoke. Most people know smoking can cause heart and lung diseases, but did you know it also contributes to weakening of your bones? Aging of your skin?  Yellowing of your teeth and nails?  CALCIUM AND VITAMIN D:  Adequate intake of calcium and Vitamin D are recommended.  The recommendations for exact amounts of these supplements seem to change often, but generally speaking 600 mg of calcium (either carbonate or citrate) and 800 units of Vitamin D per day seems prudent. Certain women may benefit from higher intake of Vitamin D.  If you are among these women, your doctor will have told you during your visit.    PAP SMEARS:  Pap smears, to check for cervical cancer or precancers,  have traditionally been done yearly, although recent scientific advances have shown that most women can have pap smears less often.  However, every woman still should have a physical exam from her gynecologist every year. It will include a breast check, inspection of the vulva and vagina to check for abnormal growths or skin changes, a visual exam of the cervix, and then an exam to evaluate the size and shape of the uterus and  ovaries.  And after 73 years of age, a rectal exam is indicated to check for rectal cancers. We will also provide age appropriate advice regarding health maintenance, like when you should have certain vaccines, screening for sexually transmitted diseases, bone density testing, colonoscopy, mammograms, etc.   MAMMOGRAMS:  All women over 40 years old should have a yearly mammogram. Many facilities now offer a "3D" mammogram, which may cost around $50 extra out of pocket. If possible,  we recommend you accept the option to have the 3D mammogram performed.  It both reduces the number of women who will be called back for extra views which then turn out to be normal, and it is better than the routine mammogram at detecting truly abnormal areas.    COLONOSCOPY:  Colonoscopy to screen for colon cancer is recommended for all women at age 50.  We know, you hate the idea of the prep.  We agree, BUT, having colon cancer and not knowing it is worse!!  Colon cancer so often starts as a polyp that can be seen and removed at colonscopy, which can quite literally save your life!  And if your first colonoscopy is normal and you have no family history of colon cancer, most women don't have to have it again for 10 years.  Once every ten years, you can do something that may end up saving your life, right?  We will be happy to help you get it scheduled when you are ready.    Be sure to check your insurance coverage so you understand how much it will cost.  It may be covered as a preventative service at no cost, but you should check your particular policy.    Hidradenitis Suppurativa Hidradenitis suppurativa is a long-term (chronic) skin disease. It is similar to a severe form of acne, but it affects areas of the body where acne would be unusual, especially areas of the body where skin rubs against skin and becomes moist. These include: Underarms. Groin. Genital area. Buttocks. Upper thighs. Breasts. Hidradenitis suppurativa  may start out as small lumps or pimples caused by blocked skin pores, sweat glands, or hair follicles. Pimples may develop into deep sores that break open (rupture) and drain pus. Over time, affected areas of skin may thicken and become scarred. This condition is rare and does not spread from person to person (non-contagious). What are the causes? The exact cause of this condition is not known. It may be related to: Female and female hormones. An overactive disease-fighting system (immune system). The immune system may over-react to blocked hair follicles or sweat glands and cause swelling and pus-filled sores. What increases the risk? You are more likely to develop this condition if you: Are female. Are 11-55 years old. Have a family history of hidradenitis suppurativa. Have a personal history of acne. Are overweight. Smoke. Take the medicine lithium. What are the signs or symptoms? The first symptoms are usually painful bumps in the skin, similar to pimples. The condition may get worse over time (progress), or it may only cause mild symptoms. If the disease progresses, symptoms may include: Skin bumps getting bigger and growing deeper into the skin. Bumps rupturing and draining pus. Itchy, infected skin. Skin getting thicker and scarred. Tunnels under the skin (fistulas) where pus drains from a bump. Pain during daily activities, such as pain during walking if your groin area is affected. Emotional problems, such as stress or depression. This condition may affect your appearance and your ability or willingness to wear certain clothes or do certain activities. How is this diagnosed? This condition is diagnosed by a health care provider who specializes in skin conditions (dermatologist). You may be diagnosed based on: Your symptoms and medical history. A physical exam. Testing a pus sample for infection. Blood tests. How is this treated? Your treatment will depend on how severe your  symptoms are. The same treatment will not work for everybody with this condition. You may need to try several treatments to find what works best for you. Treatment may include: Cleaning and bandaging (dressing) your wounds as needed. Lifestyle changes, such as new skin care routines. Taking medicines, such as: Antibiotics. Acne medicines. Medicines to reduce the activity of the immune system. A diabetes medicine (metformin). Birth control pills, for women. Steroids to reduce swelling and pain. Working with a mental health care provider, if you experience emotional distress due to this condition. If you have severe symptoms that do not get better with medicine, you may need surgery. Surgery may involve: Using a laser to clear the skin and remove hair follicles. Opening and draining deep sores. Removing the areas of skin that are diseased and scarred. Follow these instructions at home: Medicines  Take over-the-counter and prescription medicines only as told by your health care provider. If you were prescribed antibiotics, take them as told by your health care provider. Do not stop using the antibiotic even if your condition improves. Skin care If you have open wounds, cover them with a clean   dressing as told by your health care provider. Keep wounds clean by washing them gently with soap and water when you bathe. Do not shave the areas where you get hidradenitis suppurativa. Wear loose-fitting clothes. Try to avoid getting overheated or sweaty. If you get sweaty or wet, change into clean, dry clothes as soon as you can. To help relieve pain and itchiness, cover sore areas with a warm, clean washcloth (warm compress) for 5-10 minutes as often as needed. Your healthcare provider may recommend an antiperspirant deodorant that may be gentle on your skin. A daily antiseptic wash to cleanse affected areas may be suggested by your healthcare provider. General instructions Learn as much as you can  about your disease so that you have an active role in your treatment. Work closely with your health care provider to find treatments that work for you. If you are overweight, work with your health care provider to lose weight as recommended. Do not use any products that contain nicotine or tobacco. These products include cigarettes, chewing tobacco, and vaping devices, such as e-cigarettes. If you need help quitting, ask your health care provider. If you struggle with living with this condition, talk with your health care provider or work with a mental health care provider as recommended. Keep all follow-up visits. Where to find more information Hidradenitis Suppurativa Foundation, Inc.: www.hs-foundation.org American Academy of Dermatology: www.aad.org Contact a health care provider if: You have a flare-up of hidradenitis suppurativa. You have a fever or chills. You have trouble controlling your symptoms at home. You have trouble doing your daily activities because of your symptoms. You have trouble dealing with emotional problems related to your condition. Summary Hidradenitis suppurativa is a long-term (chronic) skin disease. It is similar to a severe form of acne, but it affects areas of the body where acne would be unusual. The first symptoms are usually painful bumps in the skin, similar to pimples. The condition may only cause mild symptoms, or it may get worse over time (progress). If you have open wounds, cover them with a clean dressing as told by your health care provider. Keep wounds clean by washing them gently with soap and water when you bathe. Besides skin care, treatment may include medicines, laser treatment, and surgery. This information is not intended to replace advice given to you by your health care provider. Make sure you discuss any questions you have with your health care provider. Document Revised: 06/22/2021 Document Reviewed: 06/22/2021 Elsevier Patient Education   2023 Elsevier Inc.  

## 2022-07-27 NOTE — Telephone Encounter (Signed)
[facility] running the first week in May.  Do you want me to schedule her at [facility] where they can see her in the next week or two?

## 2022-07-27 NOTE — Telephone Encounter (Signed)
Spoke with rep at [facility] and scheduled patient for 08/31/22 at 8:00am. Check in 15 minutes early.  Patient was informed of this date and time and encouraged to call and check for earlier cancellation.

## 2022-07-27 NOTE — Telephone Encounter (Signed)
I would have the patient continue with her care at the [facility] and be put on a waiting list.   She had extensive breast imaging through the [facility] in 2022.  Results are in [place].

## 2022-07-27 NOTE — Telephone Encounter (Signed)
Please schedule a diagnostic bilateral mammogram and right breast US at [facility].  Her routine mammogram is due in April.  She has puckering of her inferior lateral breast and history of breast biopsy and breast reduction.  The skin change is new for her.

## 2022-07-28 NOTE — Telephone Encounter (Signed)
Encounter reviewed and closed.  

## 2022-07-30 LAB — SURESWAB HSV, TYPE 1/2 DNA, PCR
HSV 1 DNA: NOT DETECTED
HSV 2 DNA: NOT DETECTED

## 2022-08-31 ENCOUNTER — Ambulatory Visit
Admission: RE | Admit: 2022-08-31 | Discharge: 2022-08-31 | Disposition: A | Discharge status: Home / Self Care | Payer: Federal, State, Local not specified - PPO | Source: Ambulatory Visit | Attending: Obstetrics and Gynecology | Admitting: Obstetrics and Gynecology

## 2022-08-31 DIAGNOSIS — R234 Changes in skin texture: Secondary | ICD-10-CM

## 2022-09-28 ENCOUNTER — Other Ambulatory Visit: Payer: Self-pay

## 2022-09-28 DIAGNOSIS — I722 Aneurysm of renal artery: Secondary | ICD-10-CM

## 2022-10-18 ENCOUNTER — Ambulatory Visit (HOSPITAL_BASED_OUTPATIENT_CLINIC_OR_DEPARTMENT_OTHER)
Admission: RE | Admit: 2022-10-18 | Discharge: 2022-10-18 | Disposition: A | Discharge status: Home / Self Care | Payer: Federal, State, Local not specified - PPO | Source: Ambulatory Visit | Attending: Family Medicine | Admitting: Family Medicine

## 2022-10-18 ENCOUNTER — Other Ambulatory Visit (HOSPITAL_COMMUNITY): Payer: Self-pay | Admitting: Family Medicine

## 2022-10-18 DIAGNOSIS — R1030 Lower abdominal pain, unspecified: Secondary | ICD-10-CM | POA: Insufficient documentation

## 2022-10-18 MED ORDER — IOHEXOL 300 MG/ML  SOLN
100.0000 mL | Freq: Once | INTRAMUSCULAR | Status: AC | PRN
  Administered 2022-10-18: 100 mL via INTRAVENOUS

## 2022-10-23 ENCOUNTER — Telehealth: Payer: Self-pay | Admitting: *Deleted

## 2022-10-23 NOTE — Telephone Encounter (Signed)
Patient called inquiring if the CT scan ordered for 11/01/2022 was necessary as she had one 10/18/2022 ordered by a different provider. Both orders were for CT of abdomen and pelvis. In report the renal artery size was noted.

## 2022-11-01 ENCOUNTER — Encounter (HOSPITAL_COMMUNITY): Payer: Self-pay

## 2022-11-01 ENCOUNTER — Ambulatory Visit (HOSPITAL_COMMUNITY): Payer: Federal, State, Local not specified - PPO

## 2022-11-08 ENCOUNTER — Ambulatory Visit: Payer: Federal, State, Local not specified - PPO | Admitting: Vascular Surgery

## 2022-11-08 ENCOUNTER — Encounter: Payer: Self-pay | Admitting: Vascular Surgery

## 2022-11-08 VITALS — BP 120/78 | HR 72 | Temp 97.4°F | Ht 67.5 in | Wt 233.0 lb

## 2022-11-08 DIAGNOSIS — I722 Aneurysm of renal artery: Secondary | ICD-10-CM | POA: Diagnosis not present

## 2022-11-08 NOTE — Progress Notes (Signed)
[facility] [place]  Patient name: Kayla Rush MRN: [id number] DOB: 03/14/1951 Sex: female  REASON FOR VISIT: Follow-up known right renal artery aneurysm  HPI: Kayla Rush is a 72 y.o. female here today for follow-up.  She remains in excellent overall health.  She has no symptoms referable to her right renal artery aneurysm.  Past Medical History:  Diagnosis Date   Abnormal Pap smear of cervix    in 30's, no procedure done per patient   Elevated hemoglobin A1c 11/08/2021   level:  6.8 with PCP   Fibroid    Fibromyalgia    Hypercholesteremia    Hypertension     Family History  Problem Relation Age of Onset   Cancer Mother        colon   Hypertension Mother    Heart disease Father    Heart disease Brother    Hypertension Maternal Grandmother    Hypertension Maternal Grandfather    Breast cancer Neg Hx     SOCIAL HISTORY: Social History   Tobacco Use   Smoking status: Never   Smokeless tobacco: Never  Substance Use Topics   Alcohol use: No    Alcohol/week: 0.0 standard drinks of alcohol    Allergies  Allergen Reactions   Penicillins Hives   Simvastatin Other (See Comments)    Muscle pain Muscle pain    Current Outpatient Medications  Medication Sig Dispense Refill   amLODipine (NORVASC) 5 MG tablet Take 5 mg by mouth daily.     aspirin 81 MG tablet Take 81 mg by mouth daily.     calcium carbonate (OS-CAL - DOSED IN MG OF ELEMENTAL CALCIUM) 1250 (500 Ca) MG tablet Take by mouth.     clindamycin (CLEOCIN-T) 1 % lotion Apply topically 2 (two) times daily. 60 mL 0   FREESTYLE LITE test strip      lisinopril-hydrochlorothiazide (PRINZIDE,ZESTORETIC) 20-12.5 MG per tablet      Multiple Vitamins-Minerals (MULTIVITAMIN PO) Take by mouth daily.     rosuvastatin (CRESTOR) 5 MG tablet Take 1 tablet by mouth daily.     XIIDRA 5 % SOLN INSTILL 1 DROP INTO BOTH EYES TWICE A DAY     No current  facility-administered medications for this visit.    REVIEW OF SYSTEMS:  [X]  denotes positive finding, [ ]  denotes negative finding Cardiac  Comments:  Chest pain or chest pressure:    Shortness of breath upon exertion:    Short of breath when lying flat:    Irregular heart rhythm:        Vascular    Pain in calf, thigh, or hip brought on by ambulation:    Pain in feet at night that wakes you up from your sleep:     Blood clot in your veins:    Leg swelling:           PHYSICAL EXAM: Vitals:   11/08/22 0939  BP: 120/78  Pulse: 72  Temp: (!) 97.4 F (36.3 C)  SpO2: 96%  Weight: 233 lb (105.7 kg)  Height: 5' 7.5" (1.715 m)    GENERAL: The patient is a well-nourished female, in no acute distress. The vital signs are documented above. CARDIOVASCULAR: Abdomen soft with no abdominal bruits noted PULMONARY: There is good air exchange  MUSCULOSKELETAL: There are no major deformities or cyanosis. NEUROLOGIC: No focal weakness or paresthesias are detected. SKIN: There are no ulcers or rashes noted. PSYCHIATRIC: The patient has a normal affect.  DATA:  CT scan from 10/18/2022 was reviewed with the patient.  This was compared to 09/01/2019 scan.  She has had no change in her maximal diameter of 1.4 cm in the right renal artery aneurysm.  This is in the hilar region of her renal artery.  There is slightly more calcification there had been in the past.  MEDICAL ISSUES: Stable right renal artery aneurysm, 1.4 cm.  This has been stable dating back to 2017 at its initial discovery.  I recommended that we see her again in 3 years with repeat CT scan.  I again discussed symptoms of leaking aneurysm and she knows to report immediately should this occur    Larina Earthly, MD FACS [facility] [place] Office Tel [phone]  Note: Portions of this report may have been transcribed using voice recognition software.  Every effort has been made to ensure accuracy;  however, inadvertent computerized transcription errors may still be present.

## 2023-06-15 ENCOUNTER — Other Ambulatory Visit: Payer: Self-pay | Admitting: Obstetrics and Gynecology

## 2023-06-15 DIAGNOSIS — Z0000 Encounter for general adult medical examination without abnormal findings: Secondary | ICD-10-CM

## 2023-06-27 ENCOUNTER — Ambulatory Visit: Payer: Federal, State, Local not specified - PPO | Admitting: Podiatry

## 2023-06-27 ENCOUNTER — Encounter: Payer: Self-pay | Admitting: Podiatry

## 2023-06-27 DIAGNOSIS — Q828 Other specified congenital malformations of skin: Secondary | ICD-10-CM | POA: Diagnosis not present

## 2023-06-27 DIAGNOSIS — E119 Type 2 diabetes mellitus without complications: Secondary | ICD-10-CM | POA: Diagnosis not present

## 2023-06-27 NOTE — Progress Notes (Signed)
  Subjective:  Patient ID: Kayla Rush, female    DOB: 05-06-1951,   MRN: [id number]  No chief complaint on file.   73 y.o. female presents for  concern of corns on her left foot that have been painful for a while she is hoping to get some relief. Denies burning and tingling in their feet. Patient is diabetic and last A1c was No results found for: "HGBA1C" . Relates she is well controlled.   PCP:  Mila Palmer, MD    . Denies any other pedal complaints. Denies n/v/f/c.   Past Medical History:  Diagnosis Date   Abnormal Pap smear of cervix    in 30's, no procedure done per patient   Elevated hemoglobin A1c 11/08/2021   level:  6.8 with PCP   Fibroid    Fibromyalgia    Hypercholesteremia    Hypertension     Objective:  Physical Exam: Vascular: DP/PT pulses 2/4 bilateral. CFT <3 seconds. Normal hair growth on digits. No edema.  Skin. No lacerations or abrasions bilateral feet. Hyperkeratotic cored lesion noted to plantar left heel and plantar second metatarsal head. Hyperkeratosis also noted to distal third digit on left and dorsal fifth digit on left  Musculoskeletal: MMT 5/5 bilateral lower extremities in DF, PF, Inversion and Eversion. Deceased ROM in DF of ankle joint.  Neurological: Sensation intact to light touch.   Assessment:   1. Type 2 diabetes mellitus without complication, without long-term current use of insulin (HCC)   2. Porokeratosis      Plan:  Patient was evaluated and treated and all questions answered. -Discussed and educated patient on diabetic foot care, especially with  regards to the vascular, neurological and musculoskeletal systems.  -Stressed the importance of good glycemic control and the detriment of not  controlling glucose levels in relation to the foot. -Discussed supportive shoes at all times and checking feet regularly.  -Mechanically debrided hyperkeratotic lesions x2 on plantar left foot without incident as courtesy.   -Answered all  patient questions -Patient to return  in 1 year for DM foot check.  -Patient advised to call the office if any problems or questions arise in the meantime.   Louann Sjogren, DPM

## 2023-09-03 ENCOUNTER — Ambulatory Visit
Admission: RE | Admit: 2023-09-03 | Discharge: 2023-09-03 | Disposition: A | Discharge status: Home / Self Care | Payer: Federal, State, Local not specified - PPO | Source: Ambulatory Visit | Attending: Obstetrics and Gynecology | Admitting: Obstetrics and Gynecology

## 2023-09-03 DIAGNOSIS — Z0000 Encounter for general adult medical examination without abnormal findings: Secondary | ICD-10-CM

## 2023-09-06 ENCOUNTER — Encounter: Payer: Self-pay | Admitting: Obstetrics and Gynecology

## 2023-10-10 ENCOUNTER — Ambulatory Visit: Payer: Federal, State, Local not specified - PPO | Admitting: Obstetrics and Gynecology

## 2023-10-23 ENCOUNTER — Encounter: Payer: Self-pay | Admitting: Obstetrics and Gynecology

## 2023-10-23 ENCOUNTER — Ambulatory Visit (INDEPENDENT_AMBULATORY_CARE_PROVIDER_SITE_OTHER): Payer: Federal, State, Local not specified - PPO | Admitting: Obstetrics and Gynecology

## 2023-10-23 VITALS — BP 116/74 | HR 80 | Ht 68.0 in | Wt 233.0 lb

## 2023-10-23 DIAGNOSIS — Z01419 Encounter for gynecological examination (general) (routine) without abnormal findings: Secondary | ICD-10-CM

## 2023-10-23 DIAGNOSIS — Z1331 Encounter for screening for depression: Secondary | ICD-10-CM | POA: Diagnosis not present

## 2023-10-23 NOTE — Patient Instructions (Signed)

## 2023-10-23 NOTE — Progress Notes (Signed)
 73 y.o. [id number] Married [place] American female here for annual exam.    No GYN concerns.  Voids a lot, and is hydrating a lot.   Up twice a night to void.   No urinary incontinence.  Constipation.  Does dietary adjustment.   No concerns with sexual functioning.   Downsizing their home.  Married for 40 years.  Son married some months ago. [place].    PCP: Olin Bertin, MD   Patient's last menstrual period was 06/15/1998.           Sexually active: Yes.    The current method of family planning is status post hysterectomy.    Menopausal hormone therapy:  n/a Exercising: Yes.    Walking Smoker:  no  OB History  Gravida Para Term Preterm AB Living  3 2 2  1 2   SAB IAB Ectopic Multiple Live Births  1    2    # Outcome Date GA Lbr Len/2nd Weight Sex Type Anes PTL Lv  3 SAB           2 Term     M Vag-Spont   LIV  1 Term     M Vag-Spont   LIV     HEALTH MAINTENANCE: Last 2 paps:  2010 neg  History of abnormal Pap or positive HPV:  yes 30+years ago Mammogram:   09/03/23 Breast Density Cat A, BIRADS Cat 1 neg  Colonoscopy:  2021 normal - due in 2026.  Bone Density:  05/28/18  Result  normal  Immunization History  Administered Date(s) Administered   Influenza-Unspecified 03/04/2018   Tdap 05/16/2003, 12/04/2013      reports that she has never smoked. She has never used smokeless tobacco. She reports that she does not drink alcohol and does not use drugs.  Past Medical History:  Diagnosis Date   Abnormal Pap smear of cervix    in 30's, no procedure done per patient   Elevated hemoglobin A1c 11/08/2021   level:  6.8 with PCP   Fibroid    Fibromyalgia    Hypercholesteremia    Hypertension     Past Surgical History:  Procedure Laterality Date   ABDOMINAL HYSTERECTOMY     2/00   bone spur Left    BREAST BIOPSY Right 2004   times 3   BREAST LUMPECTOMY WITH RADIOACTIVE SEED LOCALIZATION Right 03/15/2021   Procedure: RIGHT BREAST LUMPECTOMY WITH RADIOACTIVE  SEED LOCALIZATION;  Surgeon: Dareen Ebbing, MD;  Location: [facility];  Service: General;  Laterality: Right;   REDUCTION MAMMAPLASTY Bilateral 2000    Current Outpatient Medications  Medication Sig Dispense Refill   amLODipine (NORVASC) 5 MG tablet Take 5 mg by mouth daily.     aspirin 81 MG tablet Take 81 mg by mouth daily.     calcium carbonate (OS-CAL - DOSED IN MG OF ELEMENTAL CALCIUM) 1250 (500 Ca) MG tablet Take by mouth.     cycloSPORINE (RESTASIS) 0.05 % ophthalmic emulsion SMARTSIG:1 Drop(s) In Eye(s) Every 12 Hours     FREESTYLE LITE test strip      lisinopril-hydrochlorothiazide (PRINZIDE,ZESTORETIC) 20-12.5 MG per tablet      Multiple Vitamins-Minerals (MULTIVITAMIN PO) Take by mouth daily.     rosuvastatin (CRESTOR) 5 MG tablet Take 1 tablet by mouth daily.     clindamycin  (CLEOCIN -T) 1 % lotion Apply topically 2 (two) times daily. (Patient not taking: Reported on 10/23/2023) 60 mL 0   No current facility-administered medications for this visit.  ALLERGIES: Penicillins and Simvastatin  Family History  Problem Relation Age of Onset   Cancer Mother        colon   Hypertension Mother    Heart disease Father    Heart disease Brother    Hypertension Maternal Grandmother    Hypertension Maternal Grandfather    Breast cancer Neg Hx     Review of Systems  All other systems reviewed and are negative.   PHYSICAL EXAM:  BP 116/74 (BP Location: Left Arm, Patient Position: Sitting)   Pulse 80   Ht 5\' 8"  (1.727 m)   Wt 233 lb (105.7 kg)   LMP 06/15/1998   SpO2 95%   BMI 35.43 kg/m     General appearance: alert, cooperative and appears stated age Head: normocephalic, without obvious abnormality, atraumatic Neck: no adenopathy, supple, symmetrical, trachea midline and thyroid normal to inspection and palpation Lungs: clear to auscultation bilaterally Breasts: consistent with bilateral breast reduction, no masses or tenderness, No nipple retraction or  dimpling, No nipple discharge or bleeding, No axillary adenopathy Heart: regular rate and rhythm Abdomen: soft, non-tender; no masses, no organomegaly Extremities: extremities normal, atraumatic, no cyanosis or edema Skin: skin color, texture, turgor normal. No rashes or lesions Lymph nodes: cervical, supraclavicular, and axillary nodes normal. Neurologic: grossly normal  Pelvic: External genitalia:  no lesions              No abnormal inguinal nodes palpated.              Urethra:  normal appearing urethra with no masses, tenderness or lesions              Bartholins and Skenes: normal                 Vagina: normal appearing vagina with normal color and discharge, no lesions              Cervix: absent              Pap taken: no Bimanual Exam:  Uterus:  absent              Adnexa: no mass, fullness, tenderness              Rectal exam: yes.  Confirms.              Anus:  normal sphincter tone, no lesions  Chaperone was present for exam:  Cottie Diss, CMA  ASSESSMENT: Well woman visit with gynecologic exam. Status post right breast biopsy - intraductal papilloma.  Status post bilateral breast reduction Status post TAH/LSO for fibroids.  She believes right ovary remains.   FH colon cancer in mother.   PHQ-9: 0  PLAN: Mammogram screening discussed. Self breast awareness reviewed. Pap and HRV collected:  no.  Not indicated. Guidelines for Calcium, Vitamin D, regular exercise program including cardiovascular and weight bearing exercise. Medication refills:  NA Labs with PCP.  Follow up:  yearly and prn.

## 2024-02-23 ENCOUNTER — Emergency Department (HOSPITAL_BASED_OUTPATIENT_CLINIC_OR_DEPARTMENT_OTHER)

## 2024-02-23 ENCOUNTER — Emergency Department (HOSPITAL_BASED_OUTPATIENT_CLINIC_OR_DEPARTMENT_OTHER)
Admission: EM | Admit: 2024-02-23 | Discharge: 2024-02-23 | Disposition: A | Discharge status: Home / Self Care | Attending: Emergency Medicine | Admitting: Emergency Medicine

## 2024-02-23 ENCOUNTER — Encounter (HOSPITAL_BASED_OUTPATIENT_CLINIC_OR_DEPARTMENT_OTHER): Payer: Self-pay | Admitting: Emergency Medicine

## 2024-02-23 DIAGNOSIS — R110 Nausea: Secondary | ICD-10-CM | POA: Diagnosis not present

## 2024-02-23 DIAGNOSIS — E119 Type 2 diabetes mellitus without complications: Secondary | ICD-10-CM | POA: Insufficient documentation

## 2024-02-23 DIAGNOSIS — I10 Essential (primary) hypertension: Secondary | ICD-10-CM | POA: Diagnosis not present

## 2024-02-23 DIAGNOSIS — Z7982 Long term (current) use of aspirin: Secondary | ICD-10-CM | POA: Insufficient documentation

## 2024-02-23 DIAGNOSIS — Z79899 Other long term (current) drug therapy: Secondary | ICD-10-CM | POA: Insufficient documentation

## 2024-02-23 DIAGNOSIS — E871 Hypo-osmolality and hyponatremia: Secondary | ICD-10-CM | POA: Insufficient documentation

## 2024-02-23 DIAGNOSIS — R1031 Right lower quadrant pain: Secondary | ICD-10-CM | POA: Insufficient documentation

## 2024-02-23 DIAGNOSIS — R1030 Lower abdominal pain, unspecified: Secondary | ICD-10-CM

## 2024-02-23 LAB — COMPREHENSIVE METABOLIC PANEL WITH GFR
ALT: 19 U/L (ref 0–44)
AST: 23 U/L (ref 15–41)
Albumin: 4.2 g/dL (ref 3.5–5.0)
Alkaline Phosphatase: 83 U/L (ref 38–126)
Anion gap: 13 (ref 5–15)
BUN: 7 mg/dL — ABNORMAL LOW (ref 8–23)
CO2: 26 mmol/L (ref 22–32)
Calcium: 9.6 mg/dL (ref 8.9–10.3)
Chloride: 94 mmol/L — ABNORMAL LOW (ref 98–111)
Creatinine, Ser: 0.86 mg/dL (ref 0.44–1.00)
GFR, Estimated: >60 mL/min (ref >60)
Glucose, Bld: 132 mg/dL — ABNORMAL HIGH (ref 70–99)
Potassium: 3.4 mmol/L — ABNORMAL LOW (ref 3.5–5.1)
Sodium: 133 mmol/L — ABNORMAL LOW (ref 135–145)
Total Bilirubin: 0.9 mg/dL (ref 0.0–1.2)
Total Protein: 7 g/dL (ref 6.5–8.1)

## 2024-02-23 LAB — LIPASE, BLOOD: Lipase: 39 U/L (ref 11–51)

## 2024-02-23 LAB — CBC
HCT: 38.8 % (ref 36.0–46.0)
Hemoglobin: 13.9 g/dL (ref 12.0–15.0)
MCH: 27.2 pg (ref 26.0–34.0)
MCHC: 35.8 g/dL (ref 30.0–36.0)
MCV: 75.9 fL — ABNORMAL LOW (ref 80.0–100.0)
Platelets: 275 K/uL (ref 150–400)
RBC: 5.11 MIL/uL (ref 3.87–5.11)
RDW: 14.3 % (ref 11.5–15.5)
WBC: 8.4 K/uL (ref 4.0–10.5)
nRBC: 0 % (ref 0.0–0.2)

## 2024-02-23 LAB — URINALYSIS, ROUTINE W REFLEX MICROSCOPIC
Bilirubin Urine: NEGATIVE
Glucose, UA: NEGATIVE mg/dL
Ketones, ur: NEGATIVE mg/dL
Leukocytes,Ua: NEGATIVE
Nitrite: NEGATIVE
Protein, ur: NEGATIVE mg/dL
Specific Gravity, Urine: 1.01 (ref 1.005–1.030)
pH: 7 (ref 5.0–8.0)

## 2024-02-23 LAB — URINALYSIS, MICROSCOPIC (REFLEX)

## 2024-02-23 MED ORDER — ONDANSETRON HCL 4 MG/2ML IJ SOLN
4.0000 mg | Freq: Once | INTRAMUSCULAR | Status: AC
  Administered 2024-02-23: 4 mg via INTRAVENOUS
  Filled 2024-02-23: qty 2

## 2024-02-23 MED ORDER — SODIUM CHLORIDE 0.9 % IV BOLUS
500.0000 mL | Freq: Once | INTRAVENOUS | Status: AC
  Administered 2024-02-23: 500 mL via INTRAVENOUS

## 2024-02-23 MED ORDER — ONDANSETRON 4 MG PO TBDP: 4.0000 mg | ORAL_TABLET | Freq: Three times a day (TID) | ORAL | 0 refills | Status: AC | PRN

## 2024-02-23 MED ORDER — IOHEXOL 300 MG/ML  SOLN
100.0000 mL | Freq: Once | INTRAMUSCULAR | Status: AC | PRN
  Administered 2024-02-23: 100 mL via INTRAVENOUS

## 2024-02-23 MED ORDER — FENTANYL CITRATE (PF) 50 MCG/ML IJ SOSY
50.0000 ug | PREFILLED_SYRINGE | Freq: Once | INTRAMUSCULAR | Status: AC
  Administered 2024-02-23: 50 ug via INTRAVENOUS
  Filled 2024-02-23: qty 1

## 2024-02-23 NOTE — ED Triage Notes (Addendum)
 Abdominal pain all day,  was constipated and took laxatives yesterday. Hs had chills nausea and headache. States Hx of same issues for months.

## 2024-02-23 NOTE — ED Notes (Signed)
 Patient was given some peanut butter crackers and water. States she feels better since having medications. Fluids started

## 2024-02-23 NOTE — ED Notes (Signed)
Patient ambulated to the bathroom for urine sample

## 2024-02-23 NOTE — ED Provider Notes (Signed)
 [place] [facility] AT [facility] Provider Note   CSN: [id number] Arrival date & time: 02/23/24  0149     Patient presents with: Abdominal Pain   Kayla Rush is a 73 y.o. female.   The history is provided by the patient.  Abdominal Pain Kayla Rush is a 73 y.o. female who presents to the Emergency Department complaining of abdominal pain. She presents the emergency department for evaluation of burning lower abdominal pain, right greater than left that started on Thursday. She does report some associated constipation and did take a laxative on Wednesday and Thursday. She did have a small bowel movement, but did not have much out after take the medication. She does have associated nausea, decreased appetite with associated headache. She has experienced intermittent similar symptoms over the last several months without definitive diagnosis but none have been this severe. No vomiting, dysuria, chest pain, difficulty breathing, fever.  Hx/o HTN, DM     Prior to Admission medications   Medication Sig Start Date End Date Taking? Authorizing Provider  ondansetron  (ZOFRAN -ODT) 4 MG disintegrating tablet Take 1 tablet (4 mg total) by mouth every 8 (eight) hours as needed. 02/23/24  Yes Griselda Norris, MD  amLODipine (NORVASC) 5 MG tablet Take 5 mg by mouth daily.    [provider]  aspirin 81 MG tablet Take 81 mg by mouth daily.    [provider]  calcium carbonate (OS-CAL - DOSED IN MG OF ELEMENTAL CALCIUM) 1250 (500 Ca) MG tablet Take by mouth.    [provider]  clindamycin  (CLEOCIN -T) 1 % lotion Apply topically 2 (two) times daily. Patient not taking: Reported on 10/23/2023 07/27/22   Amundson C Silva, Brook E, MD  cycloSPORINE (RESTASIS) 0.05 % ophthalmic emulsion SMARTSIG:1 Drop(s) In Eye(s) Every 12 Hours 09/29/23   [provider]  FREESTYLE LITE test strip  06/25/13   [provider]   lisinopril-hydrochlorothiazide (PRINZIDE,ZESTORETIC) 20-12.5 MG per tablet  12/06/14   [provider]  Multiple Vitamins-Minerals (MULTIVITAMIN PO) Take by mouth daily.    [provider]  rosuvastatin (CRESTOR) 5 MG tablet Take 1 tablet by mouth daily.    [provider]    Allergies: Penicillins and Simvastatin    Review of Systems  Gastrointestinal:  Positive for abdominal pain.  All other systems reviewed and are negative.   Updated Vital Signs BP 134/82   Pulse (!) 59   Temp 98.2 F (36.8 C) (Oral)   Resp 18   Ht 5' 5 (1.651 m)   Wt 103.4 kg   LMP 06/15/1998   SpO2 95%   BMI 37.94 kg/m   Physical Exam Vitals and nursing note reviewed.  Constitutional:      Appearance: She is well-developed.  HENT:     Head: Normocephalic and atraumatic.  Cardiovascular:     Rate and Rhythm: Normal rate and regular rhythm.  Pulmonary:     Effort: Pulmonary effort is normal. No respiratory distress.  Abdominal:     Palpations: Abdomen is soft.     Tenderness: There is no guarding or rebound.     Comments: Mild lower abdominal tenderness, greatest over the right lower quadrant.  Musculoskeletal:        General: No tenderness.  Skin:    General: Skin is warm and dry.  Neurological:     Mental Status: She is alert and oriented to person, place, and time.  Psychiatric:        Behavior: Behavior  normal.     (all labs ordered are listed, but only abnormal results are displayed) Labs Reviewed  COMPREHENSIVE METABOLIC PANEL WITH GFR - Abnormal; Notable for the following components:      Result Value   Sodium 133 (*)    Potassium 3.4 (*)    Chloride 94 (*)    Glucose, Bld 132 (*)    BUN 7 (*)    All other components within normal limits  CBC - Abnormal; Notable for the following components:   MCV 75.9 (*)    All other components within normal limits  URINALYSIS, ROUTINE W REFLEX MICROSCOPIC - Abnormal; Notable for the following components:   Hgb  urine dipstick TRACE (*)    All other components within normal limits  URINALYSIS, MICROSCOPIC (REFLEX) - Abnormal; Notable for the following components:   Bacteria, UA FEW (*)    All other components within normal limits  LIPASE, BLOOD    EKG: None  Radiology: CT ABDOMEN PELVIS W CONTRAST Result Date: 02/23/2024 CLINICAL DATA:  Right lower quadrant pain EXAM: CT ABDOMEN AND PELVIS WITH CONTRAST TECHNIQUE: Multidetector CT imaging of the abdomen and pelvis was performed using the standard protocol following bolus administration of intravenous contrast. RADIATION DOSE REDUCTION: This exam was performed according to the departmental dose-optimization program which includes automated exposure control, adjustment of the mA and/or kV according to patient size and/or use of iterative reconstruction technique. CONTRAST:  OMNIPAQUE  IOHEXOL  300 MG/ML  SOLN COMPARISON:  10/18/2022 FINDINGS: Lower chest: Lung bases demonstrate chronic volume loss in the right middle lobe. Hepatobiliary: No focal liver abnormality is seen. No gallstones, gallbladder wall thickening, or biliary dilatation. Pancreas: Unremarkable. No pancreatic ductal dilatation or surrounding inflammatory changes. Spleen: Normal in size without focal abnormality. Adrenals/Urinary Tract: Adrenal glands show mild nodularity on the left stable from the prior exam. No discrete mass is seen. 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 No renal calculi are seen. Delayed images demonstrate normal excretion of contrast. The bladder is decompressed. Stomach/Bowel: Minimal diverticular change of the colon is noted. No diverticulitis is seen. The appendix is within normal limits. No small bowel or gastric abnormality is noted. Vascular/Lymphatic: Aortic atherosclerosis. No enlarged abdominal or pelvic lymph nodes. Reproductive: Status post hysterectomy. No adnexal masses. Other: Small fat containing umbilical hernia.  Musculoskeletal: No acute or significant osseous findings. IMPRESSION: Chronic changes without acute abnormality. Electronically Signed   By: Oneil Devonshire M.D.   On: 02/23/2024 03:29     Procedures   Medications Ordered in the ED  fentaNYL  (SUBLIMAZE ) injection 50 mcg (50 mcg Intravenous Given 02/23/24 0244)  ondansetron  (ZOFRAN ) injection 4 mg (4 mg Intravenous Given 02/23/24 0244)  iohexol  (OMNIPAQUE ) 300 MG/ML solution 100 mL (100 mLs Intravenous Contrast Given 02/23/24 0312)  sodium chloride  0.9 % bolus 500 mL (0 mLs Intravenous Stopped 02/23/24 0444)                                    Medical Decision Making Amount and/or Complexity of Data Reviewed Labs: ordered. Radiology: ordered.  Risk Prescription drug management.   Patient here for evaluation of lower abdominal pain, constipation and nausea. She has tenderness on examination without peritoneal findings. Labs are significant for mild hyponatremia, likely secondary to decreased oral intake. UA is not consistent with UTI. A CT abdomen pelvis was obtained, which is negative for acute abnormality, in particular it is negative  for appendicitis and diverticulitis. Discussed with patient unclear source of her symptoms. Her nausea is improved after ondansetron  administration. Will send prescription to the pharmacy. Discussed outpatient follow-up for further evaluation of her abdominal pain as well as return precautions for new or concerning symptoms.     Final diagnoses:  Lower abdominal pain  Nausea  Hyponatremia    ED Discharge Orders          Ordered    ondansetron  (ZOFRAN -ODT) 4 MG disintegrating tablet  Every 8 hours PRN        02/23/24 0351               Griselda Norris, MD 02/23/24 0501

## 2024-02-27 ENCOUNTER — Other Ambulatory Visit: Payer: Self-pay | Admitting: Family Medicine

## 2024-02-27 DIAGNOSIS — R1030 Lower abdominal pain, unspecified: Secondary | ICD-10-CM

## 2024-03-04 ENCOUNTER — Ambulatory Visit
Admission: RE | Admit: 2024-03-04 | Discharge: 2024-03-04 | Disposition: A | Discharge status: Home / Self Care | Source: Ambulatory Visit | Attending: Family Medicine | Admitting: Family Medicine

## 2024-03-04 DIAGNOSIS — R1030 Lower abdominal pain, unspecified: Secondary | ICD-10-CM

## 2024-07-01 ENCOUNTER — Ambulatory Visit: Payer: Federal, State, Local not specified - PPO | Admitting: Podiatry

## 2024-10-23 ENCOUNTER — Ambulatory Visit: Admitting: Obstetrics and Gynecology
